# Patient Record
Sex: Male | Born: 1965 | Race: White | Hispanic: No | Marital: Single | Smoking: Never smoker
Health system: Southern US, Community
[De-identification: ages and names within clinical notes are randomized; demographics above are authoritative.]

---

## 2020-07-21 ENCOUNTER — Other Ambulatory Visit: Payer: Self-pay

## 2020-07-21 ENCOUNTER — Emergency Department: Payer: 59

## 2020-07-21 ENCOUNTER — Emergency Department
Admission: EM | Admit: 2020-07-21 | Discharge: 2020-07-21 | Disposition: A | Discharge status: Home / Self Care | Payer: 59 | Attending: Emergency Medicine | Admitting: Emergency Medicine

## 2020-07-21 DIAGNOSIS — Z5321 Procedure and treatment not carried out due to patient leaving prior to being seen by health care provider: Secondary | ICD-10-CM | POA: Insufficient documentation

## 2020-07-21 DIAGNOSIS — M791 Myalgia, unspecified site: Secondary | ICD-10-CM | POA: Diagnosis not present

## 2020-07-21 DIAGNOSIS — R0602 Shortness of breath: Secondary | ICD-10-CM | POA: Diagnosis not present

## 2020-07-21 DIAGNOSIS — R05 Cough: Secondary | ICD-10-CM | POA: Insufficient documentation

## 2020-07-21 LAB — CBC WITH DIFFERENTIAL/PLATELET
Abs Immature Granulocytes: 0.01 10*3/uL (ref 0.00–0.07)
Basophils Absolute: 0 10*3/uL (ref 0.0–0.1)
Basophils Relative: 0 %
Eosinophils Absolute: 0 10*3/uL (ref 0.0–0.5)
Eosinophils Relative: 0 %
HCT: 45.6 % (ref 39.0–52.0)
Hemoglobin: 16 g/dL (ref 13.0–17.0)
Immature Granulocytes: 0 %
Lymphocytes Relative: 15 %
Lymphs Abs: 0.5 10*3/uL — ABNORMAL LOW (ref 0.7–4.0)
MCH: 30.4 pg (ref 26.0–34.0)
MCHC: 35.1 g/dL (ref 30.0–36.0)
MCV: 86.5 fL (ref 80.0–100.0)
Monocytes Absolute: 0.1 10*3/uL (ref 0.1–1.0)
Monocytes Relative: 3 %
Neutro Abs: 2.7 10*3/uL (ref 1.7–7.7)
Neutrophils Relative %: 82 %
Platelets: 102 10*3/uL — ABNORMAL LOW (ref 150–400)
RBC: 5.27 MIL/uL (ref 4.22–5.81)
RDW: 12.1 % (ref 11.5–15.5)
Smear Review: DECREASED
WBC: 3.2 10*3/uL — ABNORMAL LOW (ref 4.0–10.5)
nRBC: 0 % (ref 0.0–0.2)

## 2020-07-21 LAB — COMPREHENSIVE METABOLIC PANEL
ALT: 16 U/L (ref 0–44)
AST: 30 U/L (ref 15–41)
Albumin: 3.8 g/dL (ref 3.5–5.0)
Alkaline Phosphatase: 49 U/L (ref 38–126)
Anion gap: 10 (ref 5–15)
BUN: 17 mg/dL (ref 6–20)
CO2: 23 mmol/L (ref 22–32)
Calcium: 8.4 mg/dL — ABNORMAL LOW (ref 8.9–10.3)
Chloride: 103 mmol/L (ref 98–111)
Creatinine, Ser: 1.2 mg/dL (ref 0.61–1.24)
GFR calc Af Amer: >60 mL/min (ref >60)
GFR calc non Af Amer: >60 mL/min (ref >60)
Glucose, Bld: 139 mg/dL — ABNORMAL HIGH (ref 70–99)
Potassium: 4 mmol/L (ref 3.5–5.1)
Sodium: 136 mmol/L (ref 135–145)
Total Bilirubin: 0.6 mg/dL (ref 0.3–1.2)
Total Protein: 7.2 g/dL (ref 6.5–8.1)

## 2020-07-21 LAB — LACTIC ACID, PLASMA: Lactic Acid, Venous: 1.8 mmol/L (ref 0.5–1.9)

## 2020-07-21 NOTE — ED Triage Notes (Signed)
Pt states he has been feelings sick since Saturday- pt states he has had generalized body aches, no taste, and a slight cough- pt states sometimes he is [facility]

## 2020-07-25 ENCOUNTER — Ambulatory Visit (HOSPITAL_COMMUNITY)
Admission: RE | Admit: 2020-07-25 | Discharge: 2020-07-25 | Disposition: A | Discharge status: Home / Self Care | Payer: 59 | Source: Ambulatory Visit | Attending: Pulmonary Disease | Admitting: Pulmonary Disease

## 2020-07-25 ENCOUNTER — Other Ambulatory Visit (HOSPITAL_COMMUNITY): Payer: Self-pay | Admitting: Nurse Practitioner

## 2020-07-25 DIAGNOSIS — U071 COVID-19: Secondary | ICD-10-CM

## 2020-07-25 DIAGNOSIS — J9601 Acute respiratory failure with hypoxia: Secondary | ICD-10-CM | POA: Diagnosis not present

## 2020-07-25 MED ORDER — EPINEPHRINE 0.3 MG/0.3ML IJ SOAJ: 0.3000 mg | Freq: Once | INTRAMUSCULAR | Status: DC | PRN

## 2020-07-25 MED ORDER — METHYLPREDNISOLONE SODIUM SUCC 125 MG IJ SOLR: 125.0000 mg | Freq: Once | INTRAMUSCULAR | Status: DC | PRN

## 2020-07-25 MED ORDER — FAMOTIDINE IN NACL 20-0.9 MG/50ML-% IV SOLN: 20.0000 mg | Freq: Once | INTRAVENOUS | Status: DC | PRN

## 2020-07-25 MED ORDER — DIPHENHYDRAMINE HCL 50 MG/ML IJ SOLN: 50.0000 mg | Freq: Once | INTRAMUSCULAR | Status: DC | PRN

## 2020-07-25 MED ORDER — ALBUTEROL SULFATE HFA 108 (90 BASE) MCG/ACT IN AERS: 2.0000 | INHALATION_SPRAY | Freq: Once | RESPIRATORY_TRACT | Status: DC | PRN

## 2020-07-25 MED ORDER — SODIUM CHLORIDE 0.9 % IV SOLN
1200.0000 mg | Freq: Once | INTRAVENOUS | Status: AC
  Administered 2020-07-25: 1200 mg via INTRAVENOUS

## 2020-07-25 MED ORDER — SODIUM CHLORIDE 0.9 % IV SOLN: INTRAVENOUS | Status: DC | PRN

## 2020-07-25 NOTE — Progress Notes (Signed)
  Diagnosis: COVID-19  Physician: Dr. Wright  Procedure: [facility] Infusion [facility] Med: casirivimab\imdevimab infusion - Provided patient with casirivimab\imdevimab fact sheet for patients, parents and caregivers prior to infusion.  Complications: No immediate complications noted.  Discharge: Discharged home   Colton [name] [name] 07/25/2020   

## 2020-07-25 NOTE — Discharge Instructions (Signed)

## 2020-07-25 NOTE — Progress Notes (Signed)
I connected by phone with Colton [name] on 07/25/2020 at 10:38 AM to discuss the potential use of an new treatment for mild to moderate COVID-19 viral infection in non-hospitalized patients.  This patient is a 54 y.o. male that meets the FDA criteria for Emergency Use Authorization of casirivimab\imdevimab.  Has a (+) direct SARS-CoV-2 viral test result  Has mild or moderate COVID-19   Is ? 54 years of age and weighs ? 40 kg  Is NOT hospitalized due to COVID-19  Is NOT requiring oxygen therapy or requiring an increase in baseline oxygen flow rate due to COVID-19  Is within 10 days of symptom onset  Has at least one of the high risk factor(s) for progression to severe COVID-19 and/or hospitalization as defined in EUA.  Specific high risk criteria : Other high risk medical condition per CDC:  social vulnerability   I have spoken and communicated the following to the patient or parent/caregiver:  1. FDA has authorized the emergency use of casirivimab\imdevimab for the treatment of mild to moderate COVID-19 in adults and pediatric patients with positive results of direct SARS-CoV-2 viral testing who are 11 years of age and older weighing at least 40 kg, and who are at high risk for progressing to severe COVID-19 and/or hospitalization.  2. The significant known and potential risks and benefits of casirivimab\imdevimab, and the extent to which such potential risks and benefits are unknown.  3. Information on available alternative treatments and the risks and benefits of those alternatives, including clinical trials.  4. Patients treated with casirivimab\imdevimab should continue to self-isolate and use infection control measures (e.g., wear mask, isolate, social distance, avoid sharing personal items, clean and disinfect "high touch" surfaces, and frequent handwashing) according to CDC guidelines.   5. The patient or parent/caregiver has the option to accept or refuse casirivimab\imdevimab  .  After reviewing this information with the patient, The patient agreed to proceed with receiving casirivimab\imdevimab infusion and will be provided a copy of the Fact sheet prior to receiving the infusion.Consuello Masse, DNP, AGNP-C [phone] (Infusion Center Hotline)

## 2020-07-27 ENCOUNTER — Encounter (HOSPITAL_COMMUNITY): Payer: Self-pay

## 2020-07-27 ENCOUNTER — Emergency Department (HOSPITAL_COMMUNITY): Payer: 59

## 2020-07-27 ENCOUNTER — Other Ambulatory Visit: Payer: Self-pay

## 2020-07-27 ENCOUNTER — Inpatient Hospital Stay (HOSPITAL_COMMUNITY): Payer: 59

## 2020-07-27 ENCOUNTER — Inpatient Hospital Stay (HOSPITAL_COMMUNITY)
Admission: EM | Admit: 2020-07-27 | Discharge: 2020-07-29 | DRG: 177 | Disposition: A | Discharge status: Home / Self Care | Payer: 59 | Attending: Internal Medicine | Admitting: Internal Medicine

## 2020-07-27 DIAGNOSIS — U071 COVID-19: Secondary | ICD-10-CM | POA: Diagnosis present

## 2020-07-27 DIAGNOSIS — J9601 Acute respiratory failure with hypoxia: Secondary | ICD-10-CM | POA: Diagnosis present

## 2020-07-27 DIAGNOSIS — J1282 Pneumonia due to coronavirus disease 2019: Secondary | ICD-10-CM | POA: Diagnosis present

## 2020-07-27 LAB — COMPREHENSIVE METABOLIC PANEL
ALT: 28 U/L (ref 0–44)
AST: 29 U/L (ref 15–41)
Albumin: 2.9 g/dL — ABNORMAL LOW (ref 3.5–5.0)
Alkaline Phosphatase: 56 U/L (ref 38–126)
Anion gap: 14 (ref 5–15)
BUN: 20 mg/dL (ref 6–20)
CO2: 22 mmol/L (ref 22–32)
Calcium: 8.8 mg/dL — ABNORMAL LOW (ref 8.9–10.3)
Chloride: 102 mmol/L (ref 98–111)
Creatinine, Ser: 0.78 mg/dL (ref 0.61–1.24)
GFR calc Af Amer: >60 mL/min (ref >60)
GFR calc non Af Amer: >60 mL/min (ref >60)
Glucose, Bld: 106 mg/dL — ABNORMAL HIGH (ref 70–99)
Potassium: 4 mmol/L (ref 3.5–5.1)
Sodium: 138 mmol/L (ref 135–145)
Total Bilirubin: 0.7 mg/dL (ref 0.3–1.2)
Total Protein: 7 g/dL (ref 6.5–8.1)

## 2020-07-27 LAB — CBC WITH DIFFERENTIAL/PLATELET
Abs Immature Granulocytes: 0.14 10*3/uL — ABNORMAL HIGH (ref 0.00–0.07)
Basophils Absolute: 0 10*3/uL (ref 0.0–0.1)
Basophils Relative: 0 %
Eosinophils Absolute: 0.1 10*3/uL (ref 0.0–0.5)
Eosinophils Relative: 1 %
HCT: 42.8 % (ref 39.0–52.0)
Hemoglobin: 14.4 g/dL (ref 13.0–17.0)
Immature Granulocytes: 2 %
Lymphocytes Relative: 7 %
Lymphs Abs: 0.4 10*3/uL — ABNORMAL LOW (ref 0.7–4.0)
MCH: 29.9 pg (ref 26.0–34.0)
MCHC: 33.6 g/dL (ref 30.0–36.0)
MCV: 89 fL (ref 80.0–100.0)
Monocytes Absolute: 0.4 10*3/uL (ref 0.1–1.0)
Monocytes Relative: 6 %
Neutro Abs: 5.4 10*3/uL (ref 1.7–7.7)
Neutrophils Relative %: 84 %
Platelets: 303 10*3/uL (ref 150–400)
RBC: 4.81 MIL/uL (ref 4.22–5.81)
RDW: 12.1 % (ref 11.5–15.5)
WBC: 6.5 10*3/uL (ref 4.0–10.5)
nRBC: 0 % (ref 0.0–0.2)

## 2020-07-27 LAB — ABO/RH: ABO/RH(D): AB POS

## 2020-07-27 LAB — FIBRINOGEN: Fibrinogen: >800 mg/dL — ABNORMAL HIGH (ref 210–475)

## 2020-07-27 LAB — SARS CORONAVIRUS 2 BY RT PCR (HOSPITAL ORDER, PERFORMED IN ~~LOC~~ HOSPITAL LAB): SARS Coronavirus 2: NEGATIVE

## 2020-07-27 LAB — C-REACTIVE PROTEIN: CRP: 21.9 mg/dL — ABNORMAL HIGH (ref <1.0)

## 2020-07-27 LAB — D-DIMER, QUANTITATIVE: D-Dimer, Quant: 2.42 ug/mL-FEU — ABNORMAL HIGH (ref 0.00–0.50)

## 2020-07-27 LAB — FERRITIN: Ferritin: 817 ng/mL — ABNORMAL HIGH (ref 24–336)

## 2020-07-27 LAB — TRIGLYCERIDES: Triglycerides: 152 mg/dL — ABNORMAL HIGH (ref <150)

## 2020-07-27 LAB — LACTIC ACID, PLASMA: Lactic Acid, Venous: 1.4 mmol/L (ref 0.5–1.9)

## 2020-07-27 LAB — LACTATE DEHYDROGENASE: LDH: 312 U/L — ABNORMAL HIGH (ref 98–192)

## 2020-07-27 LAB — PROCALCITONIN: Procalcitonin: <0.1 ng/mL

## 2020-07-27 MED ORDER — IOHEXOL 350 MG/ML SOLN
100.0000 mL | Freq: Once | INTRAVENOUS | Status: AC | PRN
  Administered 2020-07-27: 100 mL via INTRAVENOUS

## 2020-07-27 MED ORDER — SODIUM CHLORIDE 0.9 % IV SOLN: 1.0000 g | INTRAVENOUS | Status: DC

## 2020-07-27 MED ORDER — POLYETHYLENE GLYCOL 3350 17 G PO PACK: 17.0000 g | PACK | Freq: Every day | ORAL | Status: DC | PRN

## 2020-07-27 MED ORDER — METHYLPREDNISOLONE SODIUM SUCC 125 MG IJ SOLR
0.5000 mg/kg | Freq: Two times a day (BID) | INTRAMUSCULAR | Status: DC
  Administered 2020-07-27 – 2020-07-29 (×4): 40.625 mg via INTRAVENOUS
  Filled 2020-07-27 (×4): qty 2

## 2020-07-27 MED ORDER — SODIUM CHLORIDE 0.9 % IV SOLN
100.0000 mg | Freq: Every day | INTRAVENOUS | Status: DC
  Administered 2020-07-28 – 2020-07-29 (×2): 100 mg via INTRAVENOUS
  Filled 2020-07-27 (×2): qty 20

## 2020-07-27 MED ORDER — SODIUM CHLORIDE 0.9 % IV SOLN
200.0000 mg | Freq: Once | INTRAVENOUS | Status: AC
  Administered 2020-07-27: 200 mg via INTRAVENOUS
  Filled 2020-07-27: qty 200

## 2020-07-27 MED ORDER — BARICITINIB 2 MG PO TABS
2.0000 mg | ORAL_TABLET | Freq: Every day | ORAL | Status: DC
  Filled 2020-07-27: qty 1

## 2020-07-27 MED ORDER — ONDANSETRON HCL 4 MG/2ML IJ SOLN: 4.0000 mg | Freq: Four times a day (QID) | INTRAMUSCULAR | Status: DC | PRN

## 2020-07-27 MED ORDER — ONDANSETRON HCL 4 MG PO TABS: 4.0000 mg | ORAL_TABLET | Freq: Four times a day (QID) | ORAL | Status: DC | PRN

## 2020-07-27 MED ORDER — BARICITINIB 2 MG PO TABS
4.0000 mg | ORAL_TABLET | Freq: Every day | ORAL | Status: DC
  Administered 2020-07-27 – 2020-07-29 (×3): 4 mg via ORAL
  Filled 2020-07-27 (×3): qty 2

## 2020-07-27 MED ORDER — ENOXAPARIN SODIUM 40 MG/0.4ML ~~LOC~~ SOLN
40.0000 mg | SUBCUTANEOUS | Status: DC
  Administered 2020-07-27 – 2020-07-28 (×2): 40 mg via SUBCUTANEOUS
  Filled 2020-07-27 (×2): qty 0.4

## 2020-07-27 MED ORDER — AZITHROMYCIN 250 MG PO TABS: 500.0000 mg | ORAL_TABLET | Freq: Every day | ORAL | Status: DC

## 2020-07-27 MED ORDER — HYDROCOD POLST-CPM POLST ER 10-8 MG/5ML PO SUER: 5.0000 mL | Freq: Two times a day (BID) | ORAL | Status: DC | PRN

## 2020-07-27 MED ORDER — GUAIFENESIN-DM 100-10 MG/5ML PO SYRP: 10.0000 mL | ORAL_SOLUTION | ORAL | Status: DC | PRN

## 2020-07-27 MED ORDER — DEXAMETHASONE SODIUM PHOSPHATE 10 MG/ML IJ SOLN
10.0000 mg | Freq: Once | INTRAMUSCULAR | Status: AC
  Administered 2020-07-27: 10 mg via INTRAVENOUS
  Filled 2020-07-27: qty 1

## 2020-07-27 MED ORDER — AZITHROMYCIN 250 MG PO TABS: 250.0000 mg | ORAL_TABLET | Freq: Every day | ORAL | Status: DC

## 2020-07-27 MED ORDER — ACETAMINOPHEN 325 MG PO TABS: 650.0000 mg | ORAL_TABLET | Freq: Four times a day (QID) | ORAL | Status: DC | PRN

## 2020-07-27 MED ORDER — ALBUTEROL SULFATE HFA 108 (90 BASE) MCG/ACT IN AERS: 1.0000 | INHALATION_SPRAY | RESPIRATORY_TRACT | Status: DC | PRN

## 2020-07-27 NOTE — H&P (Addendum)
History and Physical        Hospital Admission Note Date: 07/27/2020  Patient name: Colton [name] Medical record number: [id number] Date of birth: 1965-12-28 Age: 54 y.o. Gender: male  PCP: Patient, No Pcp Per  Patient coming from: home Lives with: significant other At baseline, ambulates: independently  Chief Complaint    Chief Complaint  Patient presents with  . Shortness of Breath      HPI:   This is a 54 year old male with no significant past medical history and recently diagnosed with COVID-19 last Thursday who has not been vaccinated against COVID-19 who has been having increased shortness of breath and worsening myalgias and malaise at home.  He received a dose of Regeneron on 8/30 as an outpatient.  He has noted little oxygen saturation at home.  Per EMS his O2 sats were in the 80s upon arrival.  ED Course: Afebrile hemodynamically stable requiring 4 L/min O2 and tachypneic.  Notable labs: LDH 312, triglycerides 152, ferritin 817, CRP 21.9, WBC 6.5, D-dimer 2.42, procalcitonin <0.1.  COVID-19 negative.  CXR with multifocal pneumonia.  He was given remdesivir in the ED.  Vitals:   07/27/20 1500 07/27/20 1530  BP: 128/85 134/84  Pulse: 78 79  Resp: 20   Temp:    SpO2: 95% 96%     Review of Systems:  Review of Systems  Constitutional: Positive for chills, fever and malaise/fatigue.  Respiratory: Positive for shortness of breath. Negative for wheezing.   Cardiovascular: Negative for chest pain and leg swelling.  Gastrointestinal: Negative for nausea and vomiting.  Genitourinary: Negative for dysuria and urgency.  Musculoskeletal: Positive for myalgias. Negative for falls.  Neurological: Positive for headaches. Negative for loss of consciousness.       Generalized weakness  All other systems reviewed and are negative.   Medical/Social/Family History   Past  Medical History: History reviewed. No pertinent past medical history.  History reviewed. No pertinent surgical history.  Medications: Prior to Admission medications   Medication Sig Start Date End Date Taking? Authorizing Provider  acetaminophen (TYLENOL) 500 MG tablet Take 500 mg by mouth every 6 (six) hours as needed for mild pain or headache.   Yes [provider]    Allergies:  No Known Allergies  Social History:  reports that he has never smoked. He has never used smokeless tobacco. He reports current alcohol use of about 6.0 standard drinks of alcohol per week. No history on file for drug use.  Family History: No family history on file.   Objective   Physical Exam: Blood pressure 134/84, pulse 79, temperature 99.4 F (37.4 C), temperature source Oral, resp. rate 20, SpO2 96 %.  Physical Exam Vitals and nursing note reviewed.  Constitutional:      Appearance: Normal appearance. He is diaphoretic.  HENT:     Head: Normocephalic and atraumatic.  Eyes:     Conjunctiva/sclera: Conjunctivae normal.  Cardiovascular:     Rate and Rhythm: Normal rate.     Pulses: Normal pulses.  Pulmonary:     Effort: Pulmonary effort is normal. No tachypnea.     Comments: On nasal canula Abdominal:     General: Abdomen is flat.     Palpations:  Abdomen is soft.  Musculoskeletal:        General: No swelling or tenderness.  Skin:    Coloration: Skin is not jaundiced or pale.  Neurological:     Mental Status: He is alert. Mental status is at baseline.  Psychiatric:        Mood and Affect: Mood normal.        Behavior: Behavior normal.     LABS on Admission: I have personally reviewed all the labs and imaging below    Basic Metabolic Panel: Recent Labs  Lab 07/21/20 1650 07/27/20 0658  NA 136 138  K 4.0 4.0  CL 103 102  CO2 23 22  GLUCOSE 139* 106*  BUN 17 20  CREATININE 1.20 0.78  CALCIUM 8.4* 8.8*   Liver Function Tests: Recent Labs  Lab 07/21/20 1650  07/27/20 0658  AST 30 29  ALT 16 28  ALKPHOS 49 56  BILITOT 0.6 0.7  PROT 7.2 7.0  ALBUMIN 3.8 2.9*   No results for input(s): LIPASE, AMYLASE in the last 168 hours. No results for input(s): AMMONIA in the last 168 hours. CBC: Recent Labs  Lab 07/21/20 1650 07/21/20 1650 07/27/20 0658  WBC 3.2*  --  6.5  NEUTROABS 2.7   < > 5.4  HGB 16.0  --  14.4  HCT 45.6  --  42.8  MCV 86.5   < > 89.0  PLT 102*  --  303   < > = values in this interval not displayed.   Cardiac Enzymes: No results for input(s): CKTOTAL, CKMB, CKMBINDEX, TROPONINI in the last 168 hours. BNP: Invalid input(s): POCBNP CBG: No results for input(s): GLUCAP in the last 168 hours.  Radiological Exams on Admission:  DG Chest Port 1 View  Result Date: 07/27/2020 CLINICAL DATA:  Shortness of breath and chest pain.  COVID positive. EXAM: PORTABLE CHEST 1 VIEW COMPARISON:  07/21/2020 FINDINGS: 0709 hours. Low lung volumes. Interval progression of the peripheral patchy ground-glass and consolidative airspace opacity consistent with multifocal pneumonia. No pleural effusion. Cardiopericardial silhouette is at upper limits of normal for size. The visualized bony structures of the thorax show no acute abnormality. Telemetry leads overlie the chest. IMPRESSION: Interval progression of peripheral patchy ground-glass and consolidative airspace disease consistent with multifocal pneumonia. Electronically Signed   By: Kennith Center M.D.   On: 07/27/2020 07:31      EKG: Independently reviewed.    A & P   Active Problems:   Acute hypoxemic respiratory failure due to COVID-19 (HCC)   1. Acute hypoxic respiratory failure, suspect secondary to COVID-19 infection a. Requiring 4 L/min nasal cannula, room air at baseline b. despite negative COVID-19 test today however had a COVID-19 positive last Thursday (no record in system) - received Regeneron as an outpatient 8/30 c. Remdesivir d. Baricitinib e. Solu-Medrol f. Despite  negative CXR findings concerning for multifocal pneumonia, he is afebrile here, has a negative procalcitonin and no leukocytosis - will hold antibiotics for now g. CTA chest to rule out PE given elevated D-dimer and worsening hypoxia h. Trend inflammatory markers i. Incentive spirometry j. Flutter valve k. Albuterol inhaler q4h PRN   DVT prophylaxis: lovenox   Code Status: Full Code  Diet: heart healthy Family Communication: Admission, patients condition and plan of care including tests being ordered have been discussed with the patient who indicates understanding and agrees with the plan and Code Status.  Disposition Plan: The appropriate patient status for this patient is INPATIENT. Inpatient status is  judged to be reasonable and necessary in order to provide the required intensity of service to ensure the patient's safety. The patient's presenting symptoms, physical exam findings, and initial radiographic and laboratory data in the context of their chronic comorbidities is felt to place them at high risk for further clinical deterioration. Furthermore, it is not anticipated that the patient will be medically stable for discharge from the hospital within 2 midnights of admission. The following factors support the patient status of inpatient.   " The patient's presenting symptoms include shortness of breath, hypoxia. " The worrisome physical exam findings include diaphoresis, appears ill, requiring nasal canula. " The initial radiographic and laboratory data are worrisome because of multifocal pneumonia on CXR, elevated D dimer. " The chronic co-morbidities include: no pertinent history.   * I certify that at the point of admission it is my clinical judgment that the patient will require inpatient hospital care spanning beyond 2 midnights from the point of admission due to high intensity of service, high risk for further deterioration and high frequency of surveillance required.*   Status is:  Inpatient  Remains inpatient appropriate because:Inpatient level of care appropriate due to severity of illness   Dispo: The patient is from: Home              Anticipated d/c is to: Home              Anticipated d/c date is: 3 days              Patient currently is not medically stable to d/c.    Consultants  . none  Procedures  . none  Time Spent on Admission: 60 minutes    Jae Dire, DO [facility] Pager [phone] 07/27/2020, 4:18 PM

## 2020-07-27 NOTE — ED Triage Notes (Signed)
Patient arrived via [facility] with complaints of shortness of breath. Patient diagnosed with Covid-19 on Thursday. Ems states 80% on RA upon arrival. 98% O2 on 2L

## 2020-07-27 NOTE — ED Provider Notes (Addendum)
Assumed care of patient at 7 AM.  Patient with Covid for the past 10 days or so.  Room air oxygenation is 80 but on 2 to 4 L patient is above 90%.  Overall appears comfortable but hypoxia in the setting of Covid will need admission.  Awaiting lab work and imaging anticipate admission.  Given steroids and IV remdesivir.  No significant anemia, electrolyte abnormality.  Inflammatory markers elevated including fibrinogen, LDH, triglycerides, D-dimer.  Patient comfortable on several liters of oxygen.  Mildly tachypneic.  Will admit to hospitalist for further care.  However repeat Covid testing here is negative but suspect symptoms are secondary to Covid.  We'll get a CT scan of chest after discussion with medicine.  This chart was dictated using voice recognition software.  Despite best efforts to proofread,  errors can occur which can change the documentation meaning.   Colton [name] was evaluated in Emergency Department on 07/27/2020 for the symptoms described in the history of present illness. He was evaluated in the context of the global COVID-19 pandemic, which necessitated consideration that the patient might be at risk for infection with the SARS-CoV-2 virus that causes COVID-19. Institutional protocols and algorithms that pertain to the evaluation of patients at risk for COVID-19 are in a state of rapid change based on information released by regulatory bodies including the CDC and federal and state organizations. These policies and algorithms were followed during the patient's care in the ED.    Virgina Norfolk, DO 07/27/20 0809    Virgina Norfolk, DO 07/27/20 5885

## 2020-07-27 NOTE — ED Provider Notes (Signed)
[facility]-EMERGENCY DEPT Provider Note   CSN: [id number] Arrival date & time: 07/27/20  0539     History Chief Complaint  Patient presents with  . Shortness of Breath    Colton [name] is a 54 y.o. male.  HPI     This a 54 year old male with no reported past medical history who presents with worsening shortness of breath.  Patient reports he is on day 13 of symptoms from COVID-19.  He reports shortness of breath, decreased exercise tolerance, cough, myalgias, back pain.  He tested positive for COVID-19 last Thursday.  However, he began having symptoms the Saturday before that.  He did receive a dose of Regeneron as an outpatient on 8/30.  He denies any history of smoking.  Patient reports that "I have just been trying to get through it."  He states however his symptoms have gotten worse and he has noted low O2 sats at home.  Per EMS O2 sats were in the 80s upon arrival.  He is not vaccinated against COVID-19.  History reviewed. No pertinent past medical history.  There are no problems to display for this patient.   History reviewed. No pertinent surgical history.     No family history on file.  Social History   Tobacco Use  . Smoking status: Never Smoker  . Smokeless tobacco: Never Used  Substance Use Topics  . Alcohol use: Yes    Alcohol/week: 6.0 standard drinks    Types: 6 Cans of beer per week  . Drug use: Not on file    Home Medications Prior to Admission medications   Not on File    Allergies    Patient has no known allergies.  Review of Systems   Review of Systems  Constitutional: Positive for chills and fever.  Respiratory: Positive for cough and shortness of breath.   Cardiovascular: Negative for chest pain.  Gastrointestinal: Positive for diarrhea and nausea. Negative for abdominal pain and vomiting.  Genitourinary: Negative for dysuria.  All other systems reviewed and are negative.   Physical Exam Updated Vital Signs BP  123/82 (BP Location: Left Arm)   Pulse 93   Temp 99.4 F (37.4 C) (Oral)   Resp (!) 29   SpO2 97%   Physical Exam Vitals and nursing note reviewed.  Constitutional:      Appearance: He is well-developed. He is ill-appearing. He is not toxic-appearing.  HENT:     Head: Normocephalic and atraumatic.  Eyes:     Pupils: Pupils are equal, round, and reactive to light.  Cardiovascular:     Rate and Rhythm: Normal rate and regular rhythm.     Heart sounds: Normal heart sounds. No murmur heard.   Pulmonary:     Effort: Pulmonary effort is normal. No respiratory distress.     Breath sounds: Rhonchi present. No wheezing.     Comments: Tachypnea noted, speaking in short sentences, nasal cannula in place Abdominal:     General: Bowel sounds are normal.     Palpations: Abdomen is soft.     Tenderness: There is no abdominal tenderness. There is no rebound.  Musculoskeletal:     Cervical back: Neck supple.     Right lower leg: No tenderness. No edema.     Left lower leg: No tenderness. No edema.  Lymphadenopathy:     Cervical: No cervical adenopathy.  Skin:    General: Skin is warm and dry.  Neurological:     Mental Status: He is alert and  oriented to person, place, and time.  Psychiatric:        Mood and Affect: Mood normal.     ED Results / Procedures / Treatments   Labs (all labs ordered are listed, but only abnormal results are displayed) Labs Reviewed  SARS CORONAVIRUS 2 BY RT PCR (HOSPITAL ORDER, PERFORMED IN [facility])  CULTURE, BLOOD (ROUTINE X 2)  CULTURE, BLOOD (ROUTINE X 2)  LACTIC ACID, PLASMA  LACTIC ACID, PLASMA  CBC WITH DIFFERENTIAL/PLATELET  COMPREHENSIVE METABOLIC PANEL  D-DIMER, QUANTITATIVE (NOT AT [facility])  PROCALCITONIN  LACTATE DEHYDROGENASE  FERRITIN  TRIGLYCERIDES  FIBRINOGEN  C-REACTIVE PROTEIN    EKG EKG Interpretation  Date/Time:  Wednesday July 27 2020 06:31:14 EDT Ventricular Rate:  92 PR Interval:    QRS  Duration: 89 QT Interval:  348 QTC Calculation: 431 R Axis:   46 Text Interpretation: Sinus rhythm Borderline short PR interval RSR' in V1 or V2, probably normal variant Confirmed by Ross Marcus ([id number]) on 07/27/2020 6:44:03 AM   Radiology No results found.  Procedures Procedures (including critical care time)  CRITICAL CARE Performed by: Shon Baton   Total critical care time: 35 minutes  Critical care time was exclusive of separately billable procedures and treating other patients.  Critical care was necessary to treat or prevent imminent or life-threatening deterioration.  Critical care was time spent personally by me on the following activities: development of treatment plan with patient and/or surrogate as well as nursing, discussions with consultants, evaluation of patient's response to treatment, examination of patient, obtaining history from patient or surrogate, ordering and performing treatments and interventions, ordering and review of laboratory studies, ordering and review of radiographic studies, pulse oximetry and re-evaluation of patient's condition.   Medications Ordered in ED Medications  dexamethasone (DECADRON) injection 10 mg (10 mg Intravenous Given 07/27/20 0160)    ED Course  I have reviewed the triage vital signs and the nursing notes.  Pertinent labs & imaging results that were available during my care of the patient were reviewed by me and considered in my medical decision making (see chart for details).    MDM Rules/Calculators/A&P                           Patient presents with ongoing symptoms of COVID-19.  Found hypoxic by EMS.  He is overall nontoxic on exam.  He is on 2 L of nasal cannula and satting mid 90s.  He does appear tachypneic and is speaking in short sentences.  EKG without acute ischemic changes.  COVID-19 work-up initiated.  I cannot see his prior test results but I do see where he received his Regeneron infusion on 8/30.   Work-up pending.  Patient will need admission for hypoxic respiratory failure.  Patient signed out to oncoming provider.  Final Clinical Impression(s) / ED Diagnoses Final diagnoses:  COVID-19  Acute respiratory failure with hypoxia [facility])    Rx / DC Orders ED Discharge Orders    None       Shon Baton, MD 07/27/20 [phone]

## 2020-07-28 LAB — CBC WITH DIFFERENTIAL/PLATELET
Abs Immature Granulocytes: 0.13 10*3/uL — ABNORMAL HIGH (ref 0.00–0.07)
Basophils Absolute: 0 10*3/uL (ref 0.0–0.1)
Basophils Relative: 0 %
Eosinophils Absolute: 0 10*3/uL (ref 0.0–0.5)
Eosinophils Relative: 0 %
HCT: 45.4 % (ref 39.0–52.0)
Hemoglobin: 15.3 g/dL (ref 13.0–17.0)
Immature Granulocytes: 2 %
Lymphocytes Relative: 9 %
Lymphs Abs: 0.5 10*3/uL — ABNORMAL LOW (ref 0.7–4.0)
MCH: 30.5 pg (ref 26.0–34.0)
MCHC: 33.7 g/dL (ref 30.0–36.0)
MCV: 90.6 fL (ref 80.0–100.0)
Monocytes Absolute: 0.2 10*3/uL (ref 0.1–1.0)
Monocytes Relative: 4 %
Neutro Abs: 4.8 10*3/uL (ref 1.7–7.7)
Neutrophils Relative %: 85 %
Platelets: 396 10*3/uL (ref 150–400)
RBC: 5.01 MIL/uL (ref 4.22–5.81)
RDW: 12.2 % (ref 11.5–15.5)
WBC: 5.7 10*3/uL (ref 4.0–10.5)
nRBC: 0 % (ref 0.0–0.2)

## 2020-07-28 LAB — D-DIMER, QUANTITATIVE: D-Dimer, Quant: 2.29 ug/mL-FEU — ABNORMAL HIGH (ref 0.00–0.50)

## 2020-07-28 LAB — C-REACTIVE PROTEIN: CRP: 23 mg/dL — ABNORMAL HIGH (ref <1.0)

## 2020-07-28 LAB — HIV ANTIBODY (ROUTINE TESTING W REFLEX): HIV Screen 4th Generation wRfx: NONREACTIVE

## 2020-07-28 LAB — MAGNESIUM: Magnesium: 2.4 mg/dL (ref 1.7–2.4)

## 2020-07-28 LAB — FERRITIN: Ferritin: 978 ng/mL — ABNORMAL HIGH (ref 24–336)

## 2020-07-28 NOTE — Plan of Care (Signed)
Plan of care discussed.   

## 2020-07-28 NOTE — Progress Notes (Signed)
PROGRESS NOTE    Colton [name]  HQI:[id number] DOB: 1966-06-19 DOA: 07/27/2020 PCP: Patient, No Pcp Per    Brief Narrative:  This is a 54 year old male with no significant past medical history and recently diagnosed with COVID-19 last Thursday who has not been vaccinated against COVID-19 who has been having increased shortness of breath and worsening myalgias and malaise at home.  He received a dose of Regeneron on 8/30 as an outpatient.  He has noted little oxygen saturation at home.  Per EMS his O2 sats were in the 80s upon arrival.  ED Course: Afebrile hemodynamically stable requiring 4 L/min O2 and tachypneic.  Notable labs: LDH 312, triglycerides 152, ferritin 817, CRP 21.9, WBC 6.5, D-dimer 2.42, procalcitonin <0.1.  COVID-19 negative.  CXR with multifocal pneumonia.  He was given remdesivir in the ED.   Assessment & Plan:   Active Problems:   Acute hypoxemic respiratory failure due to COVID-19 [facility](HCC)  Acute hypoxemic respiratory failure due to COVID-19 virus infection: Continue to monitor due to significant symptoms  chest physiotherapy, incentive spirometry, deep breathing exercises, sputum induction, mucolytic's and bronchodilators. Supplemental oxygen to keep saturations more than 90%. Covid directed therapy with , steroids , Solu-Medrol 0.5 mg/kg twice a day remdesivir, day 2/5 Baricitinib day 2/14 or until hospital discharge antibiotics, not indicated Due to severity of symptoms, patient will need daily inflammatory markers, chest x-rays, liver function test to monitor and direct COVID-19 therapies.  COVID-19 Labs  Recent Labs    07/27/20 0658 07/28/20 0352  DDIMER 2.42* 2.29*  FERRITIN 817* 978*  LDH 312*  --   CRP 21.9* 23.0*    Lab Results  Component Value Date   SARSCOV2NAA NEGATIVE 07/27/2020  Reported positive test on 8/26 at home   SpO2: 95 % O2 Flow Rate (L/min): 4 L/min   DVT prophylaxis: enoxaparin (LOVENOX) injection 40 mg Start: 07/27/20  2200 SCDs Start: 07/27/20 1534   Code Status: Full code Family Communication: None Disposition Plan: Status is: Inpatient  Remains inpatient appropriate because:Inpatient level of care appropriate due to severity of illness   Dispo: The patient is from: Home              Anticipated d/c is to: Home              Anticipated d/c date is: 2 days              Patient currently is not medically stable to d/c.         Consultants:   None  Procedures:   None  Antimicrobials:  Anti-infectives (From admission, onward)   Start     Dose/Rate Route Frequency Ordered Stop   07/28/20 1000  remdesivir 100 mg in sodium chloride 0.9 % 100 mL IVPB       "Followed by" Linked Group Details   100 mg 200 mL/hr over 30 Minutes Intravenous Daily 07/27/20 0724 08/01/20 0959   07/28/20 1000  azithromycin (ZITHROMAX) tablet 250 mg  Status:  Discontinued       "Followed by" Linked Group Details   250 mg Oral Daily 07/27/20 1533 07/27/20 1652   07/27/20 1545  cefTRIAXone (ROCEPHIN) 1 g in sodium chloride 0.9 % 100 mL IVPB  Status:  Discontinued        1 g 200 mL/hr over 30 Minutes Intravenous Every 24 hours 07/27/20 1533 07/27/20 1652   07/27/20 1545  azithromycin (ZITHROMAX) tablet 500 mg  Status:  Discontinued       "Followed by"  Linked Group Details   500 mg Oral Daily 07/27/20 1533 07/27/20 1652   07/27/20 0800  remdesivir 200 mg in sodium chloride 0.9% 250 mL IVPB       "Followed by" Linked Group Details   200 mg 580 mL/hr over 30 Minutes Intravenous Once 07/27/20 0724 07/27/20 2585         Subjective: Patient was seen and examined.  No overnight events.  He was on 3 to 4 L of oxygen.  Very anxious to not to go to work.  He wants to go home as he feels better than yesterday and he wants to go back to work tomorrow.  I explained to him that he has active viral infection and needing supplemental oxygen. Will monitor him tonight, if remains stable anticipate go home tomorrow with  outpatient infusion.  Objective: Vitals:   07/27/20 1914 07/27/20 2055 07/28/20 0100 07/28/20 0447  BP:  122/83 117/83 109/77  Pulse:  79 75 76  Resp:  18 18 16   Temp:  97.9 F (36.6 C) 97.7 F (36.5 C) 97.7 F (36.5 C)  TempSrc:   Oral   SpO2:  93% 93% 95%  Weight: 74.5 kg     Height: 5\' 6"  (1.676 m)       Intake/Output Summary (Last 24 hours) at 07/28/2020 1339 Last data filed at 07/28/2020 0900 Gross per 24 hour  Intake 1800 ml  Output 400 ml  Net 1400 ml   Filed Weights   07/27/20 1914  Weight: 74.5 kg    Examination:  General exam: Appears comfortable, however is anxious. Respiratory system: Clear to auscultation. Respiratory effort normal.  No added sounds. Cardiovascular system: S1 & S2 heard, RRR. No JVD, murmurs, rubs, gallops or clicks. No pedal edema. Gastrointestinal system: Abdomen is nondistended, soft and nontender. No organomegaly or masses felt. Normal bowel sounds heard. Central nervous system: Alert and oriented. No focal neurological deficits. Extremities: Symmetric 5 x 5 power. Skin: No rashes, lesions or ulcers Psychiatry: Judgement and insight appear normal. Mood & affect appropriate.     Data Reviewed: I have personally reviewed following labs and imaging studies  CBC: Recent Labs  Lab 07/21/20 1650 07/27/20 0658 07/28/20 0352  WBC 3.2* 6.5 5.7  NEUTROABS 2.7 5.4 4.8  HGB 16.0 14.4 15.3  HCT 45.6 42.8 45.4  MCV 86.5 89.0 90.6  PLT 102* 303 396   Basic Metabolic Panel: Recent Labs  Lab 07/21/20 1650 07/27/20 0658 07/28/20 0352  NA 136 138  --   K 4.0 4.0  --   CL 103 102  --   CO2 23 22  --   GLUCOSE 139* 106*  --   BUN 17 20  --   CREATININE 1.20 0.78  --   CALCIUM 8.4* 8.8*  --   MG  --   --  2.4   GFR: Estimated Creatinine Clearance: 98.6 mL/min (by C-G formula based on SCr of 0.78 mg/dL). Liver Function Tests: Recent Labs  Lab 07/21/20 1650 07/27/20 0658  AST 30 29  ALT 16 28  ALKPHOS 49 56  BILITOT 0.6 0.7   PROT 7.2 7.0  ALBUMIN 3.8 2.9*   No results for input(s): LIPASE, AMYLASE in the last 168 hours. No results for input(s): AMMONIA in the last 168 hours. Coagulation Profile: No results for input(s): INR, PROTIME in the last 168 hours. Cardiac Enzymes: No results for input(s): CKTOTAL, CKMB, CKMBINDEX, TROPONINI in the last 168 hours. BNP (last 3 results) No results for input(s):  PROBNP in the last 8760 hours. HbA1C: No results for input(s): HGBA1C in the last 72 hours. CBG: No results for input(s): GLUCAP in the last 168 hours. Lipid Profile: Recent Labs    07/27/20 0658  TRIG 152*   Thyroid Function Tests: No results for input(s): TSH, T4TOTAL, FREET4, T3FREE, THYROIDAB in the last 72 hours. Anemia Panel: Recent Labs    07/27/20 0658 07/28/20 0352  FERRITIN 817* 978*   Sepsis Labs: Recent Labs  Lab 07/21/20 1650 07/27/20 0658  PROCALCITON  --  <0.10  LATICACIDVEN 1.8 1.4    Recent Results (from the past 240 hour(s))  SARS Coronavirus 2 by RT PCR (hospital order, performed in [facility] hospital lab) Nasopharyngeal Nasopharyngeal Swab     Status: None   Collection Time: 07/27/20  6:58 AM   Specimen: Nasopharyngeal Swab  Result Value Ref Range Status   SARS Coronavirus 2 NEGATIVE NEGATIVE Final    Comment: (NOTE) SARS-CoV-2 target nucleic acids are NOT DETECTED.  The SARS-CoV-2 RNA is generally detectable in upper and lower respiratory specimens during the acute phase of infection. The lowest concentration of SARS-CoV-2 viral copies this assay can detect is 250 copies / mL. A negative result does not preclude SARS-CoV-2 infection and should not be used as the sole basis for treatment or other patient management decisions.  A negative result may occur with improper specimen collection / handling, submission of specimen other than nasopharyngeal swab, presence of viral mutation(s) within the areas targeted by this assay, and inadequate number of viral  copies (<250 copies / mL). A negative result must be combined with clinical observations, patient history, and epidemiological information.  Fact Sheet for Patients:   BoilerBrush.com.cy  Fact Sheet for Healthcare Providers: https://pope.com/  This test is not yet approved or  cleared by the [place] FDA and has been authorized for detection and/or diagnosis of SARS-CoV-2 by FDA under an Emergency Use Authorization (EUA).  This EUA will remain in effect (meaning this test can be used) for the duration of the COVID-19 declaration under Section 564(b)(1) of the Act, 21 U.S.C. section 360bbb-3(b)(1), unless the authorization is terminated or revoked sooner.  Performed at [facility], [address] [place], [place] [address]   Blood Culture (routine x 2)     Status: None (Preliminary result)   Collection Time: 07/27/20  6:58 AM   Specimen: BLOOD  Result Value Ref Range Status   Specimen Description   Final    BLOOD LEFT ANTECUBITAL Performed at [facility], [address] [place], [place] [address]    Special Requests   Final    BOTTLES DRAWN AEROBIC AND ANAEROBIC Blood Culture adequate volume Performed at [facility], [address] [place], [place] [address]    Culture   Final    NO GROWTH < 24 HOURS Performed at [facility], [address], [place], [place] [address]    Report Status PENDING  Incomplete  Blood Culture (routine x 2)     Status: None (Preliminary result)   Collection Time: 07/27/20  4:50 PM   Specimen: BLOOD  Result Value Ref Range Status   Specimen Description   Final    BLOOD RIGHT ANTECUBITAL Performed at [facility], [address] [place], [place] [address]    Special Requests   Final    BOTTLES DRAWN AEROBIC ONLY Blood Culture adequate volume Performed at [facility], [address] [place],  [place] [address]    Culture   Final    NO GROWTH < 12 HOURS Performed at [facility], [address], [place], [place] [address]    Report Status PENDING  Incomplete         Radiology Studies: CT ANGIO CHEST PE W OR WO CONTRAST  Result Date: 07/27/2020 CLINICAL DATA:  54 year old patient who is COVID-19 positive, presenting with progressively worsening shortness of breath, myalgias and malaise. Hypoxemia with oxygen saturation in the 80s. Elevated D-dimer. EXAM: CT ANGIOGRAPHY CHEST WITH CONTRAST TECHNIQUE: Multidetector CT imaging of the chest was performed using the standard protocol during bolus administration of intravenous contrast. Multiplanar CT image reconstructions and MIPs were obtained to evaluate the vascular anatomy. CONTRAST:  OMNIPAQUE IOHEXOL 350 MG/ML IV. COMPARISON:  None. FINDINGS: Respiratory motion blurred images throughout the examination. Cardiovascular: Contrast opacification of the pulmonary arteries is good. Allowing for the respiratory motion, no visible filling defects within either main pulmonary artery or their segmental branches in either lung to suggest pulmonary embolism. Heart size upper normal. No visible coronary atherosclerosis. No pericardial effusion. No evidence of atherosclerosis involving the thoracic or upper abdominal aorta or their visualized branches. 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 Mediastinum/Nodes: Upper normal sized subcarinal and RIGHT paraesophageal lymph nodes (stations 6 and 7). No pathologic lymphadenopathy. Normal appearing esophagus. Normal-appearing thyroid gland. Lungs/Pleura: Ground-glass airspace opacities throughout both lungs in a peripheral distribution, more focally confluent in the LEFT LOWER LOBE. No evidence of interstitial lung disease. No parenchymal nodules or masses. No pleural effusions. Central airways patent without significant bronchial  wall thickening. Upper Abdomen: Unremarkable for the early arterial phase of enhancement. Musculoskeletal: Mild degenerative disc disease and spondylosis involving the visualized lower cervical spine and throughout the thoracic spine. No acute findings. Other: Mild BILATERAL gynecomastia, RIGHT greater than LEFT. Benign sebaceous cyst measuring approximately 1.7 cm involving the skin of the mid back, just to the LEFT of midline. Review of the MIP images confirms the above findings. IMPRESSION: 1. Respiratory motion degraded examination demonstrates no evidence of central pulmonary embolism. 2. Ground-glass airspace opacities throughout both lungs in a peripheral distribution, more focally confluent in the LEFT LOWER LOBE, indicating COVID 19 pneumonia. Other processes such as influenza pneumonia and organizing pneumonia, as can be seen with drug toxicity and connective tissue disease, can cause a similar imaging pattern. 3. Upper normal sized subcarinal and RIGHT paraesophageal lymph nodes, likely reactive. 4. Mild BILATERAL gynecomastia, RIGHT greater than LEFT. 5. 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 Typically this is of no clinical significance. Electronically Signed   By: Hulan Saas M.D.   On: 07/27/2020 19:18   DG Chest Port 1 View  Result Date: 07/27/2020 CLINICAL DATA:  Shortness of breath and chest pain.  COVID positive. EXAM: PORTABLE CHEST 1 VIEW COMPARISON:  07/21/2020 FINDINGS: 0709 hours. Low lung volumes. Interval progression of the peripheral patchy ground-glass and consolidative airspace opacity consistent with multifocal pneumonia. No pleural effusion. Cardiopericardial silhouette is at upper limits of normal for size. The visualized bony structures of the thorax show no acute abnormality. Telemetry leads overlie the chest. IMPRESSION: Interval progression of peripheral patchy ground-glass and consolidative airspace disease consistent with  multifocal pneumonia. Electronically Signed   By: Kennith Center M.D.   On: 07/27/2020 07:31        Scheduled Meds: . baricitinib  4 mg Oral Daily  .  enoxaparin (LOVENOX) injection  40 mg Subcutaneous Q24H  . methylPREDNISolone (SOLU-MEDROL) injection  0.5 mg/kg Intravenous Q12H   Continuous Infusions: . remdesivir 100 mg in NS 100 mL 100 mg (07/28/20 1002)     LOS: 1 day    Time spent: 30 minutes    Dorcas Carrow, MD [facility] Pager [phone]

## 2020-07-29 LAB — CBC WITH DIFFERENTIAL/PLATELET
Abs Immature Granulocytes: 0.29 10*3/uL — ABNORMAL HIGH (ref 0.00–0.07)
Basophils Absolute: 0 10*3/uL (ref 0.0–0.1)
Basophils Relative: 0 %
Eosinophils Absolute: 0 10*3/uL (ref 0.0–0.5)
Eosinophils Relative: 0 %
HCT: 43.6 % (ref 39.0–52.0)
Hemoglobin: 14.3 g/dL (ref 13.0–17.0)
Immature Granulocytes: 2 %
Lymphocytes Relative: 6 %
Lymphs Abs: 0.8 10*3/uL (ref 0.7–4.0)
MCH: 30.1 pg (ref 26.0–34.0)
MCHC: 32.8 g/dL (ref 30.0–36.0)
MCV: 91.8 fL (ref 80.0–100.0)
Monocytes Absolute: 0.6 10*3/uL (ref 0.1–1.0)
Monocytes Relative: 4 %
Neutro Abs: 12.5 10*3/uL — ABNORMAL HIGH (ref 1.7–7.7)
Neutrophils Relative %: 88 %
Platelets: 473 10*3/uL — ABNORMAL HIGH (ref 150–400)
RBC: 4.75 MIL/uL (ref 4.22–5.81)
RDW: 12.2 % (ref 11.5–15.5)
WBC: 14.2 10*3/uL — ABNORMAL HIGH (ref 4.0–10.5)
nRBC: 0 % (ref 0.0–0.2)

## 2020-07-29 LAB — MAGNESIUM: Magnesium: 2.4 mg/dL (ref 1.7–2.4)

## 2020-07-29 LAB — D-DIMER, QUANTITATIVE: D-Dimer, Quant: 1.69 ug/mL-FEU — ABNORMAL HIGH (ref 0.00–0.50)

## 2020-07-29 LAB — C-REACTIVE PROTEIN: CRP: 8.7 mg/dL — ABNORMAL HIGH (ref <1.0)

## 2020-07-29 LAB — FERRITIN: Ferritin: 805 ng/mL — ABNORMAL HIGH (ref 24–336)

## 2020-07-29 MED ORDER — DEXAMETHASONE 6 MG PO TABS: 6.0000 mg | ORAL_TABLET | Freq: Every day | ORAL | 0 refills | Status: AC

## 2020-07-29 NOTE — Progress Notes (Signed)
Pt discharged to home with home Oxygen. Discharge instructions and medication education provided to pt.

## 2020-07-29 NOTE — Progress Notes (Signed)
Patient scheduled for outpatient Remdesivir infusions at 11am on Saturday 9/4 and Sunday 9/5 at [facility]. Please inform the patient to park at [address] [place], [place], as staff will be escorting the patient through the east entrance of the hospital.    There is a wave flag banner located near the entrance on [address]. Turn into this entrance and immediately turn left and park in 1 of the 5 designated Covid Infusion Parking spots. There is a phone number on the sign, please call and let the staff know what spot you are in and we will come out and get you. For questions call [phone].  Thanks.

## 2020-07-29 NOTE — Discharge Instructions (Signed)
Patient scheduled for outpatient Remdesivir infusions at 11am on Saturday 9/4 and Sunday 9/5 at [facility]. Please inform the patient to park at[address], [place], as staff will be escorting the patient through the east entrance of the hospital.Appointments will take approximately 45 minutes.   There is a wave flag banner located near the entrance on [address]. Turn into this entranceand immediatelyturn left and park in 1 of the 5 designated Covid Infusion Parking spots. There is a phone number on the sign, please call and let the staff know what spot you are in and we will come out and get you. For questions call [phone]. Thanks.

## 2020-07-29 NOTE — Progress Notes (Signed)
The patient states, "I'm leaving today, I don't care what I have to do."  He states he has an important business meeting he can't miss, needs to spend some time with his Mom who is in town, and he just can't take it here anymore and he " just needs to get away."  Spoke to him about completion of his IV tx and discussed AMA with him.

## 2020-07-29 NOTE — Plan of Care (Signed)
Plan of care reviewed and discussed with the patient. 

## 2020-07-29 NOTE — Progress Notes (Signed)
Oxygen at rest on room air 90% Oxygen on ambulation on room air 86% Oxygen with 4 liters supplemental oxygen 90%   Patient will need supplemental oxygen to improve his heart rate and symptoms and to relieve hypoxia.

## 2020-07-29 NOTE — Discharge Summary (Signed)
Physician Discharge Summary  Colton [name] XNA:[id number] DOB: 1966/01/11 DOA: 07/27/2020  PCP: Patient, No Pcp Per  Admit date: 07/27/2020 Discharge date: 07/29/2020  Admitted From: Home Disposition: Home  Recommendations for Outpatient Follow-up:  1. Follow up with PCP in 1-2 weeks 2. Please obtain BMP/CBC in one week 3. Please come back to [facility] infusion center for scheduled infusion for next 2 days.  Home Health: Not applicable Equipment/Devices: Oxygen 4 L/min on ambulation  Discharge Condition: Stable CODE STATUS: Full code Diet recommendation: Regular diet  Discharge summary: 54 year old gentleman with no significant medical history recently diagnosed with COVID-19, unvaccinated presented to the ER with increasing shortness of breath and worsening fatigue and weakness.  Received a dose of Regeneron on 8/30 as an outpatient.  On arrival, patient was afebrile hemodynamically stable.  He was hypoxic and requiring 4 L of oxygen and was tachypneic.  CRP was 21.  D-dimer was 2.42.  COVID-19 was negative.  He had a positive test at home from last week.  Chest x-ray with multifocal pneumonia.  Patient was admitted to the hospital and treated with high-dose steroids and remdesivir therapy as well as baricitinib.  Today, patient has symptomatically improved.  He has finished 3 days of treatment with remdesivir and his steroids.  He is ambulating around and needing 4 L of oxygen on mobility.  His inflammatory markers are improving.  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  Since he is a stable, he is discharged home with a scheduled outpatient remdesivir infusion for next 2 days, 7 more days of oral steroids, symptomatic treatment and advised.  Patient was also prescribed oxygen to use at home and instruction to monitor levels at home and report for any difficulty breathing or worsening symptoms.  Discharge Diagnoses:   Active Problems:   Acute hypoxemic respiratory failure due to COVID-19 [facility])    Discharge Instructions  Discharge Instructions    Call MD for:  difficulty breathing, headache or visual disturbances   Complete by: As directed    Call MD for:  extreme fatigue   Complete by: As directed    Call MD for:  persistant nausea and vomiting   Complete by: As directed    Call MD for:  temperature >100.4   Complete by: As directed    Diet general   Complete by: As directed    Discharge instructions   Complete by: As directed    Can use over the counter cough medicine and tylenol Take covid 19 vaccine after improvement of current symptoms Isolate for 21 days from your positive test results   Increase activity slowly   Complete by: As directed      Allergies as of 07/29/2020   No Known Allergies     Medication List    TAKE these medications   acetaminophen 500 MG tablet Commonly known as: TYLENOL Take 500 mg by mouth every 6 (six) hours as needed for mild pain or headache.   dexamethasone 6 MG tablet Commonly known as: Decadron Take 1 tablet (6 mg total) by mouth daily for 7 days.            Durable Medical Equipment  (From admission, onward)         Start     Ordered   07/29/20 0846  For home use only DME oxygen  Once       Comments: Oxygen at rest on room air  90% Oxygen on ambulation on room air 86% Oxygen with 4 liters supplemental oxygen 90%   Patient will need supplemental oxygen to improve his heart rate and symptoms and to relieve hypoxia.  Question Answer Comment  Length of Need 6 Months   Mode or (Route) Nasal cannula   Liters per Minute 4   Frequency Continuous (stationary and portable oxygen unit needed)   Oxygen conserving device Yes   Oxygen delivery system Gas      07/29/20 0845          No Known Allergies  Consultations:  None   Procedures/Studies: DG Chest 2 View  Result Date: 07/21/2020 CLINICAL DATA:  Short of breath.  Body aches  and fever. EXAM: CHEST - 2 VIEW COMPARISON:  None. FINDINGS: Heart is normal in size. Patchy heterogeneous bilateral airspace opacities in a mid-lower lung zone predominant distribution. No pneumothorax or pleural effusion. No acute osseous abnormalities are seen. IMPRESSION: Patchy heterogeneous bilateral airspace opacities in a mid-lower lung zone predominant distribution consistent with multifocal pneumonia. Pattern is typical of COVID-19. Electronically Signed   By: Narda Rutherford M.D.   On: 07/21/2020 17:50   CT ANGIO CHEST PE W OR WO CONTRAST  Result Date: 07/27/2020 CLINICAL DATA:  55 year old patient who is COVID-19 positive, presenting with progressively worsening shortness of breath, myalgias and malaise. Hypoxemia with oxygen saturation in the 80s. Elevated D-dimer. EXAM: CT ANGIOGRAPHY CHEST WITH CONTRAST TECHNIQUE: Multidetector CT imaging of the chest was performed using the standard protocol during bolus administration of intravenous contrast. Multiplanar CT image reconstructions and MIPs were obtained to evaluate the vascular anatomy. CONTRAST:  OMNIPAQUE IOHEXOL 350 MG/ML IV. COMPARISON:  None. FINDINGS: Respiratory motion blurred images throughout the examination. Cardiovascular: Contrast opacification of the pulmonary arteries is good. Allowing for the respiratory motion, no visible filling defects within either main pulmonary artery or their segmental branches in either lung to suggest pulmonary embolism. Heart size upper normal. No visible coronary atherosclerosis. No pericardial effusion. No evidence of atherosclerosis involving the thoracic or upper abdominal aorta or their visualized branches. 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 Mediastinum/Nodes: Upper normal sized subcarinal and RIGHT paraesophageal lymph nodes (stations 6 and 7). No pathologic lymphadenopathy. Normal appearing esophagus. Normal-appearing thyroid  gland. Lungs/Pleura: Ground-glass airspace opacities throughout both lungs in a peripheral distribution, more focally confluent in the LEFT LOWER LOBE. No evidence of interstitial lung disease. No parenchymal nodules or masses. No pleural effusions. Central airways patent without significant bronchial wall thickening. Upper Abdomen: Unremarkable for the early arterial phase of enhancement. Musculoskeletal: Mild degenerative disc disease and spondylosis involving the visualized lower cervical spine and throughout the thoracic spine. No acute findings. Other: Mild BILATERAL gynecomastia, RIGHT greater than LEFT. Benign sebaceous cyst measuring approximately 1.7 cm involving the skin of the mid back, just to the LEFT of midline. Review of the MIP images confirms the above findings. IMPRESSION: 1. Respiratory motion degraded examination demonstrates no evidence of central pulmonary embolism. 2. Ground-glass airspace opacities throughout both lungs in a peripheral distribution, more focally confluent in the LEFT LOWER LOBE, indicating COVID 19 pneumonia. Other processes such as influenza pneumonia and organizing pneumonia, as can be seen with drug toxicity and connective tissue disease, can cause a similar imaging pattern. 3. Upper normal sized subcarinal and RIGHT paraesophageal lymph nodes, likely reactive. 4. Mild BILATERAL gynecomastia, RIGHT greater than LEFT. 5. Compression of the proximal celiac trunk by the arcuate ligament of  the diaphragm. Typically this is of no clinical significance. Electronically Signed   By: Hulan Saashomas  Lawrence M.D.   On: 07/27/2020 19:18   DG Chest Port 1 View  Result Date: 07/27/2020 CLINICAL DATA:  Shortness of breath and chest pain.  COVID positive. EXAM: PORTABLE CHEST 1 VIEW COMPARISON:  07/21/2020 FINDINGS: 0709 hours. Low lung volumes. Interval progression of the peripheral patchy ground-glass and consolidative airspace opacity consistent with multifocal pneumonia. No pleural  effusion. Cardiopericardial silhouette is at upper limits of normal for size. The visualized bony structures of the thorax show no acute abnormality. Telemetry leads overlie the chest. IMPRESSION: Interval progression of peripheral patchy ground-glass and consolidative airspace disease consistent with multifocal pneumonia. Electronically Signed   By: Kennith CenterEric  Mansell M.D.   On: 07/27/2020 07:31   (Echo, Carotid, EGD, Colonoscopy, ERCP)    Subjective: Patient seen and examined.  He feels better.  Still has some shortness of breath and feels nervous on walking, however comfortable to go home and he must go home today. We discussed different options and since he wants to go home, discharged him on request with all the treatments and oxygen arrangements.   Discharge Exam: Vitals:   07/28/20 2034 07/29/20 0425  BP: 119/76 120/80  Pulse: 87 76  Resp:  20  Temp: 99 F (37.2 C) 98.4 F (36.9 C)  SpO2: 96% 95%   Vitals:   07/28/20 0447 07/28/20 1406 07/28/20 2034 07/29/20 0425  BP: 109/77 119/84 119/76 120/80  Pulse: 76 86 87 76  Resp: 16 18  20   Temp: 97.7 F (36.5 C) 98.2 F (36.8 C) 99 F (37.2 C) 98.4 F (36.9 C)  TempSrc:  Oral    SpO2: 95% 91% 96% 95%  Weight:      Height:        General: Pt is alert, awake, not in acute distress Cardiovascular: RRR, S1/S2 +, no rubs, no gallops Respiratory: CTA bilaterally, no wheezing, no rhonchi, no added sounds. Abdominal: Soft, NT, ND, bowel sounds + Extremities: no edema, no cyanosis    The results of significant diagnostics from this hospitalization (including imaging, microbiology, ancillary and laboratory) are listed below for reference.     Microbiology: Recent Results (from the past 240 hour(s))  SARS Coronavirus 2 by RT PCR (hospital order, performed in [facility] hospital lab) Nasopharyngeal Nasopharyngeal Swab     Status: None   Collection Time: 07/27/20  6:58 AM   Specimen: Nasopharyngeal Swab  Result Value Ref Range  Status   SARS Coronavirus 2 NEGATIVE NEGATIVE Final    Comment: (NOTE) SARS-CoV-2 target nucleic acids are NOT DETECTED.  The SARS-CoV-2 RNA is generally detectable in upper and lower respiratory specimens during the acute phase of infection. The lowest concentration of SARS-CoV-2 viral copies this assay can detect is 250 copies / mL. A negative result does not preclude SARS-CoV-2 infection and should not be used as the sole basis for treatment or other patient management decisions.  A negative result may occur with improper specimen collection / handling, submission of specimen other than nasopharyngeal swab, presence of viral mutation(s) within the areas targeted by this assay, and inadequate number of viral copies (<250 copies / mL). A negative result must be combined with clinical observations, patient history, and epidemiological information.  Fact Sheet for Patients:   BoilerBrush.com.cyhttps://www.fda.gov/media/136312/download  Fact Sheet for Healthcare Providers: https://pope.com/https://www.fda.gov/media/136313/download  This test is not yet approved or  cleared by the [place] FDA and has been authorized for detection and/or diagnosis of  SARS-CoV-2 by FDA under an Emergency Use Authorization (EUA).  This EUA will remain in effect (meaning this test can be used) for the duration of the COVID-19 declaration under Section 564(b)(1) of the Act, 21 U.S.C. section 360bbb-3(b)(1), unless the authorization is terminated or revoked sooner.  Performed at [facility], [address] [place], [place] [address]   Blood Culture (routine x 2)     Status: None (Preliminary result)   Collection Time: 07/27/20  6:58 AM   Specimen: BLOOD  Result Value Ref Range Status   Specimen Description   Final    BLOOD LEFT ANTECUBITAL Performed at [facility], [address] [place], [place] [address]    Special Requests   Final    BOTTLES DRAWN AEROBIC AND ANAEROBIC Blood Culture  adequate volume Performed at [facility], [address] [place], [place] [address]    Culture   Final    NO GROWTH 2 DAYS Performed at [facility], [address] [place], [place] [address]    Report Status PENDING  Incomplete  Blood Culture (routine x 2)     Status: None (Preliminary result)   Collection Time: 07/27/20  4:50 PM   Specimen: BLOOD  Result Value Ref Range Status   Specimen Description   Final    BLOOD RIGHT ANTECUBITAL Performed at [facility], [address], [place], [place] [address]    Special Requests   Final    BOTTLES DRAWN AEROBIC ONLY Blood Culture adequate volume Performed at [facility], [address] [place], [place] [address]    Culture   Final    NO GROWTH 2 DAYS Performed at [facility], [address] [place], [place] [address]    Report Status PENDING  Incomplete     Labs: BNP (last 3 results) No results for input(s): BNP in the last 8760 hours. Basic Metabolic Panel: Recent Labs  Lab 07/27/20 0658 07/28/20 0352 07/29/20 0351  NA 138  --   --   K 4.0  --   --   CL 102  --   --   CO2 22  --   --   GLUCOSE 106*  --   --   BUN 20  --   --   CREATININE 0.78  --   --   CALCIUM 8.8*  --   --   MG  --  2.4 2.4   Liver Function Tests: Recent Labs  Lab 07/27/20 0658  AST 29  ALT 28  ALKPHOS 56  BILITOT 0.7  PROT 7.0  ALBUMIN 2.9*   No results for input(s): LIPASE, AMYLASE in the last 168 hours. No results for input(s): AMMONIA in the last 168 hours. CBC: Recent Labs  Lab 07/27/20 0658 07/28/20 0352 07/29/20 0351  WBC 6.5 5.7 14.2*  NEUTROABS 5.4 4.8 12.5*  HGB 14.4 15.3 14.3  HCT 42.8 45.4 43.6  MCV 89.0 90.6 91.8  PLT 303 396 473*   Cardiac Enzymes: No results for input(s): CKTOTAL, CKMB, CKMBINDEX, TROPONINI in the last 168 hours. BNP: Invalid input(s): POCBNP CBG: No results for input(s): GLUCAP in the last 168 hours. D-Dimer Recent  Labs    07/28/20 0352 07/29/20 0351  DDIMER 2.29* 1.69*   Hgb A1c No results for input(s): HGBA1C in the last 72 hours. Lipid Profile Recent Labs    07/27/20 0658  TRIG 152*   Thyroid function studies No results for input(s):  TSH, T4TOTAL, T3FREE, THYROIDAB in the last 72 hours.  Invalid input(s): FREET3 Anemia work up [employer]    07/28/20 0352 07/29/20 0351  FERRITIN 978* 805*   Urinalysis No results found for: COLORURINE, APPEARANCEUR, LABSPEC, PHURINE, GLUCOSEU, HGBUR, BILIRUBINUR, KETONESUR, PROTEINUR, UROBILINOGEN, NITRITE, LEUKOCYTESUR Sepsis Labs Invalid input(s): PROCALCITONIN,  WBC,  LACTICIDVEN Microbiology Recent Results (from the past 240 hour(s))  SARS Coronavirus 2 by RT PCR (hospital order, performed in [facility] hospital lab) Nasopharyngeal Nasopharyngeal Swab     Status: None   Collection Time: 07/27/20  6:58 AM   Specimen: Nasopharyngeal Swab  Result Value Ref Range Status   SARS Coronavirus 2 NEGATIVE NEGATIVE Final    Comment: (NOTE) SARS-CoV-2 target nucleic acids are NOT DETECTED.  The SARS-CoV-2 RNA is generally detectable in upper and lower respiratory specimens during the acute phase of infection. The lowest concentration of SARS-CoV-2 viral copies this assay can detect is 250 copies / mL. A negative result does not preclude SARS-CoV-2 infection and should not be used as the sole basis for treatment or other patient management decisions.  A negative result may occur with improper specimen collection / handling, submission of specimen other than nasopharyngeal swab, presence of viral mutation(s) within the areas targeted by this assay, and inadequate number of viral copies (<250 copies / mL). A negative result must be combined with clinical observations, patient history, and epidemiological information.  Fact Sheet for Patients:   BoilerBrush.com.cy  Fact Sheet for Healthcare  Providers: https://pope.com/  This test is not yet approved or  cleared by the [place] FDA and has been authorized for detection and/or diagnosis of SARS-CoV-2 by FDA under an Emergency Use Authorization (EUA).  This EUA will remain in effect (meaning this test can be used) for the duration of the COVID-19 declaration under Section 564(b)(1) of the Act, 21 U.S.C. section 360bbb-3(b)(1), unless the authorization is terminated or revoked sooner.  Performed at [facility], [address], [place], [place] [address]   Blood Culture (routine x 2)     Status: None (Preliminary result)   Collection Time: 07/27/20  6:58 AM   Specimen: BLOOD  Result Value Ref Range Status   Specimen Description   Final    BLOOD LEFT ANTECUBITAL Performed at [facility], [address], [place], [place] [address]    Special Requests   Final    BOTTLES DRAWN AEROBIC AND ANAEROBIC Blood Culture adequate volume Performed at [facility], [address] [place], [place] [address]    Culture   Final    NO GROWTH 2 DAYS Performed at [facility], [address], [place], [place] [address]    Report Status PENDING  Incomplete  Blood Culture (routine x 2)     Status: None (Preliminary result)   Collection Time: 07/27/20  4:50 PM   Specimen: BLOOD  Result Value Ref Range Status   Specimen Description   Final    BLOOD RIGHT ANTECUBITAL Performed at [facility], [address], [place], [place] [address]    Special Requests   Final    BOTTLES DRAWN AEROBIC ONLY Blood Culture adequate volume Performed at [facility], [address] [place], [place] [address]    Culture   Final    NO GROWTH 2 DAYS Performed at [facility], [address] [place], [place] [address]    Report Status PENDING  Incomplete     Time coordinating  discharge: 40  minutes  SIGNED:   Dorcas Carrow, MD  [facility] 07/29/2020, 11:55 AM

## 2020-07-29 NOTE — TOC Transition Note (Signed)
Transition of Care [facility]) - CM/SW Discharge Note   Patient Details  Name: Colton [name] MRN: [id number] Date of Birth: 1965/12/31  [facility]) CM/SW Contact:  Ida Rogue, LCSW Phone Number: 07/29/2020, 10:01 AM   Clinical Narrative:  Patient d/cing today is in need of home O2-4L.  Called Ashly with [employer] who will have travel O2 delivered here, and also deliver home unit.  Patient has ride home.  No further needs identified. TOC sign off.     Final next level of care: Home/Self Care Barriers to Discharge: No Barriers Identified   Patient Goals and CMS Choice        Discharge Placement                       Discharge Plan and Services                                     Social Determinants of Health (SDOH) Interventions     Readmission Risk Interventions No flowsheet data found.

## 2020-07-30 ENCOUNTER — Ambulatory Visit (HOSPITAL_COMMUNITY)
Admit: 2020-07-30 | Discharge: 2020-07-30 | Disposition: A | Discharge status: Home / Self Care | Payer: 59 | Attending: Pulmonary Disease | Admitting: Pulmonary Disease

## 2020-07-30 DIAGNOSIS — J1282 Pneumonia due to coronavirus disease 2019: Secondary | ICD-10-CM | POA: Insufficient documentation

## 2020-07-30 DIAGNOSIS — U071 COVID-19: Secondary | ICD-10-CM | POA: Insufficient documentation

## 2020-07-30 MED ORDER — DIPHENHYDRAMINE HCL 50 MG/ML IJ SOLN: 50.0000 mg | Freq: Once | INTRAMUSCULAR | Status: DC | PRN

## 2020-07-30 MED ORDER — SODIUM CHLORIDE 0.9 % IV SOLN
100.0000 mg | Freq: Once | INTRAVENOUS | Status: AC
  Administered 2020-07-30: 100 mg via INTRAVENOUS
  Filled 2020-07-30: qty 20

## 2020-07-30 MED ORDER — FAMOTIDINE IN NACL 20-0.9 MG/50ML-% IV SOLN: 20.0000 mg | Freq: Once | INTRAVENOUS | Status: DC | PRN

## 2020-07-30 MED ORDER — EPINEPHRINE 0.3 MG/0.3ML IJ SOAJ: 0.3000 mg | Freq: Once | INTRAMUSCULAR | Status: DC | PRN

## 2020-07-30 MED ORDER — METHYLPREDNISOLONE SODIUM SUCC 125 MG IJ SOLR: 125.0000 mg | Freq: Once | INTRAMUSCULAR | Status: DC | PRN

## 2020-07-30 MED ORDER — ALBUTEROL SULFATE HFA 108 (90 BASE) MCG/ACT IN AERS: 2.0000 | INHALATION_SPRAY | Freq: Once | RESPIRATORY_TRACT | Status: DC | PRN

## 2020-07-30 MED ORDER — SODIUM CHLORIDE 0.9 % IV SOLN: INTRAVENOUS | Status: DC | PRN

## 2020-07-30 NOTE — Discharge Instructions (Signed)
10 Things You Can Do to Manage Your COVID-19 Symptoms at Home If you have possible or confirmed COVID-19: 1. Stay home from work and school. And stay away from other public places. If you must go out, avoid using any kind of public transportation, ridesharing, or taxis. 2. Monitor your symptoms carefully. If your symptoms get worse, call your healthcare provider immediately. 3. Get rest and stay hydrated. 4. If you have a medical appointment, call the healthcare provider ahead of time and tell them that you have or may have COVID-19. 5. For medical emergencies, call 911 and notify the dispatch personnel that you have or may have COVID-19. 6. Cover your cough and sneezes with a tissue or use the inside of your elbow. 7. Wash your hands often with soap and water for at least 20 seconds or clean your hands with an alcohol-based hand sanitizer that contains at least 60% alcohol. 8. As much as possible, stay in a specific room and away from other people in your home. Also, you should use a separate bathroom, if available. If you need to be around other people in or outside of the home, wear a mask. 9. Avoid sharing personal items with other people in your household, like dishes, towels, and bedding. 10. Clean all surfaces that are touched often, like counters, tabletops, and doorknobs. Use household cleaning sprays or wipes according to the label instructions. cdc.gov/coronavirus 05/27/2019 This information is not intended to replace advice given to you by your health care provider. Make sure you discuss any questions you have with your health care provider. Document Revised: 10/29/2019 Document Reviewed: 10/29/2019 Elsevier Patient Education  2020 Elsevier Inc.  

## 2020-07-30 NOTE — Progress Notes (Signed)
  Diagnosis: COVID-19  Physician: Dr. Delford Field  Procedure: [facility] Med: remdesivir infusion - Provided patient with remdesivir fact sheet for patients, parents and caregivers prior to infusion.  Complications: No immediate complications noted.  Discharge: Discharged home   Phylisha Dix Mliss Sax 07/30/2020

## 2020-07-31 ENCOUNTER — Ambulatory Visit (HOSPITAL_COMMUNITY)
Admit: 2020-07-31 | Discharge: 2020-07-31 | Disposition: A | Discharge status: Home / Self Care | Payer: 59 | Attending: Pulmonary Disease | Admitting: Pulmonary Disease

## 2020-07-31 DIAGNOSIS — U071 COVID-19: Secondary | ICD-10-CM | POA: Insufficient documentation

## 2020-07-31 DIAGNOSIS — J1282 Pneumonia due to coronavirus disease 2019: Secondary | ICD-10-CM | POA: Diagnosis not present

## 2020-07-31 MED ORDER — DIPHENHYDRAMINE HCL 50 MG/ML IJ SOLN: 50.0000 mg | Freq: Once | INTRAMUSCULAR | Status: DC | PRN

## 2020-07-31 MED ORDER — SODIUM CHLORIDE 0.9 % IV SOLN: INTRAVENOUS | Status: DC | PRN

## 2020-07-31 MED ORDER — ALBUTEROL SULFATE HFA 108 (90 BASE) MCG/ACT IN AERS: 2.0000 | INHALATION_SPRAY | Freq: Once | RESPIRATORY_TRACT | Status: DC | PRN

## 2020-07-31 MED ORDER — METHYLPREDNISOLONE SODIUM SUCC 125 MG IJ SOLR: 125.0000 mg | Freq: Once | INTRAMUSCULAR | Status: DC | PRN

## 2020-07-31 MED ORDER — EPINEPHRINE 0.3 MG/0.3ML IJ SOAJ: 0.3000 mg | Freq: Once | INTRAMUSCULAR | Status: DC | PRN

## 2020-07-31 MED ORDER — FAMOTIDINE IN NACL 20-0.9 MG/50ML-% IV SOLN: 20.0000 mg | Freq: Once | INTRAVENOUS | Status: DC | PRN

## 2020-07-31 MED ORDER — SODIUM CHLORIDE 0.9 % IV SOLN
100.0000 mg | Freq: Once | INTRAVENOUS | Status: AC
  Administered 2020-07-31: 100 mg via INTRAVENOUS
  Filled 2020-07-31: qty 20

## 2020-07-31 NOTE — Progress Notes (Signed)
  Diagnosis: COVID-19  Physician: Dr. Wright  Procedure: [facility] Med: remdesivir infusion - Provided patient with remdesivir fact sheet for patients, parents and caregivers prior to infusion.  Complications: No immediate complications noted.  Discharge: Discharged home   Colton [name] 07/31/2020  

## 2020-08-01 LAB — CULTURE, BLOOD (ROUTINE X 2)
Culture: NO GROWTH
Culture: NO GROWTH
Special Requests: ADEQUATE
Special Requests: ADEQUATE

## 2021-03-22 IMAGING — DX DG CHEST 1V PORT
1 series · 1 of 1 positions shown · non-contrast
Comparison: 07/21/2020

CLINICAL DATA: Shortness of breath and chest pain.  COVID positive.

EXAM:
PORTABLE CHEST 1 VIEW

[chest ap]
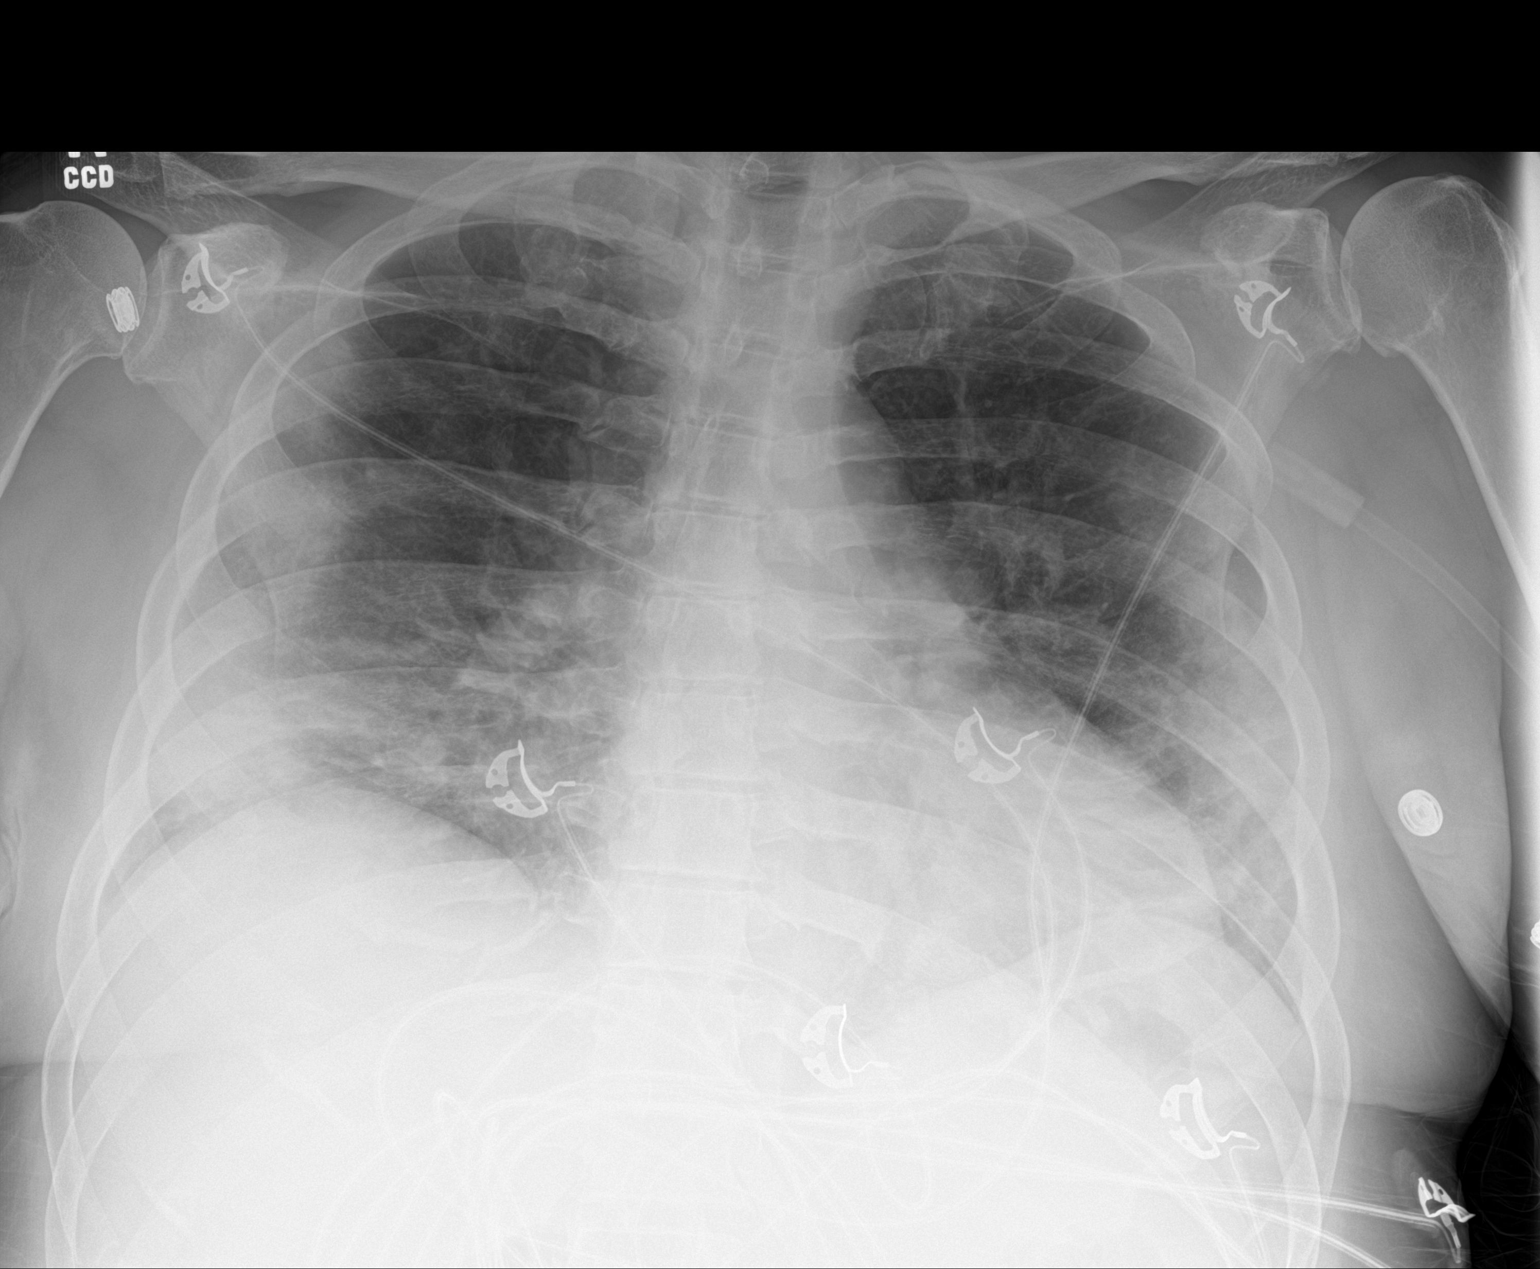

[1 of 1 positions shown; findings below may reference images not displayed]

FINDINGS: 4841 hours. Low lung volumes. Interval progression of the peripheral
patchy ground-glass and consolidative airspace opacity consistent
with multifocal pneumonia. No pleural effusion. Cardiopericardial
silhouette is at upper limits of normal for size. The visualized
bony structures of the thorax show no acute abnormality. Telemetry
leads overlie the chest.
IMPRESSION: Interval progression of peripheral patchy ground-glass and
consolidative airspace disease consistent with multifocal pneumonia.

## 2021-03-22 IMAGING — CT CT ANGIO CHEST
2 of 7 series · 17 of 46 positions shown · IV contrast (APPLIED)
Comparison: None.

CLINICAL DATA: 53-year-old patient who is [id number] positive,
presenting with progressively worsening shortness of breath,
myalgias and malaise. Hypoxemia with oxygen saturation in the 80s.
Elevated D-dimer.

EXAM:
CT ANGIOGRAPHY CHEST WITH CONTRAST
TECHNIQUE: Multidetector CT imaging of the chest was performed using the
standard protocol during bolus administration of intravenous
contrast. Multiplanar CT image reconstructions and MIPs were
obtained to evaluate the vascular anatomy.
CONTRAST:  100mL OMNIPAQUE IOHEXOL 350 MG/ML IV.

[Series 5: thins · axial · 0.77mm/px · z∈[+1334,+1604]mm · 15 of 309 slices shown]
[im 20/309  lung]
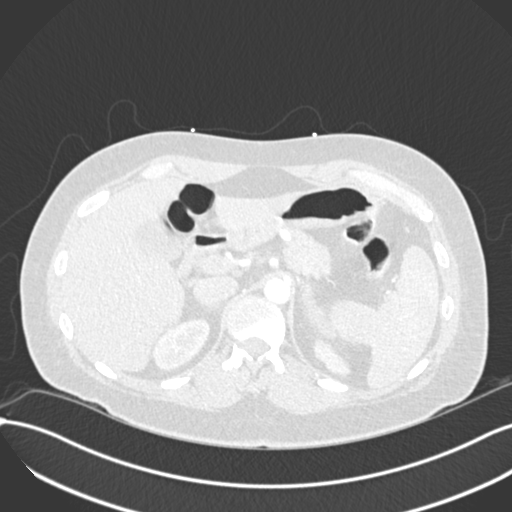
[im 39/309  soft-tissue]
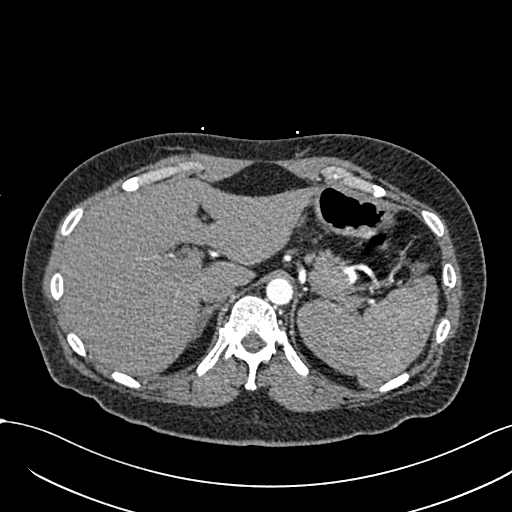
[im 58/309  lung]
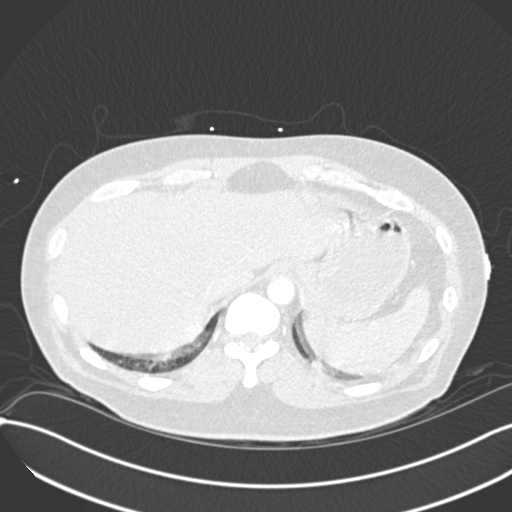
[im 78/309  soft-tissue]
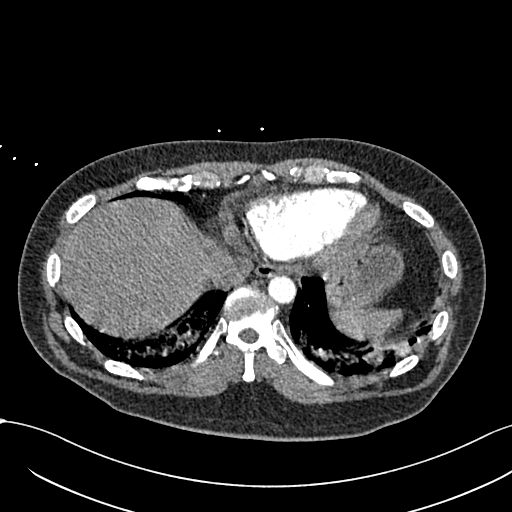
[im 97/309  lung]
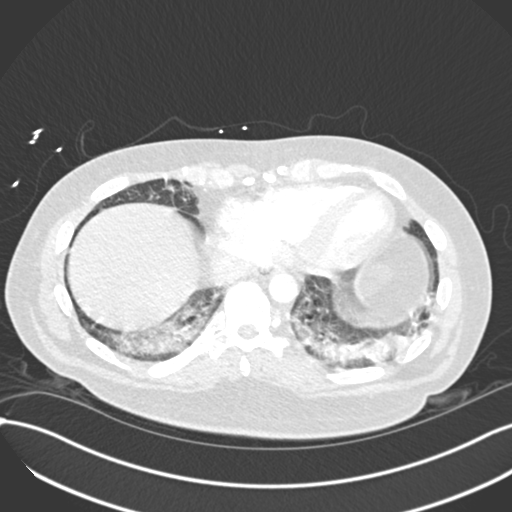
[im 116/309  soft-tissue]
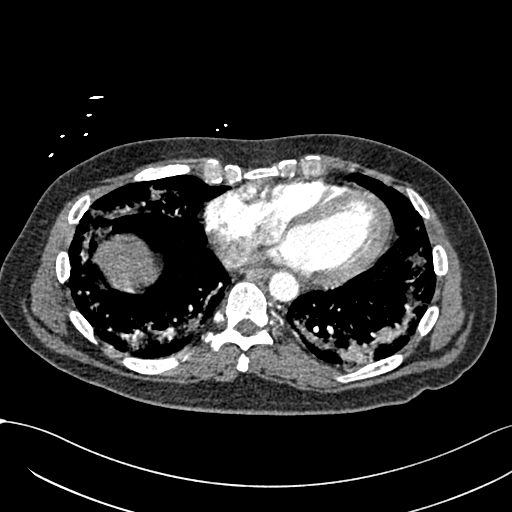
[im 135/309  lung]
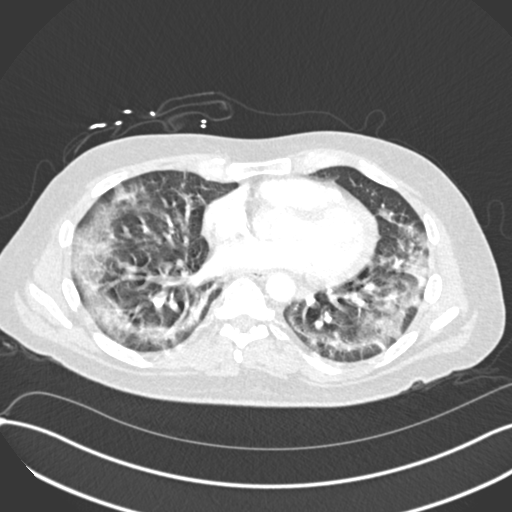
[im 155/309  soft-tissue]
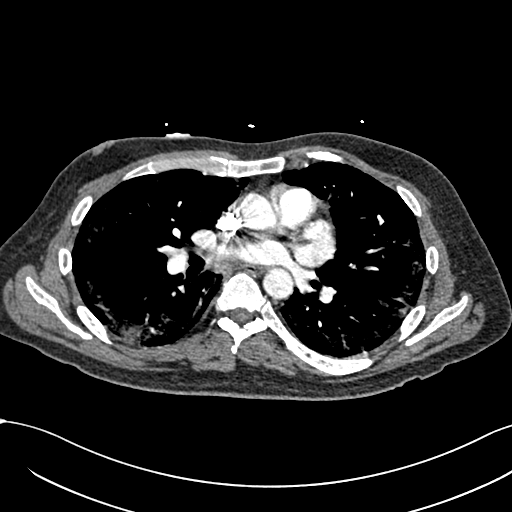
[im 174/309  lung]
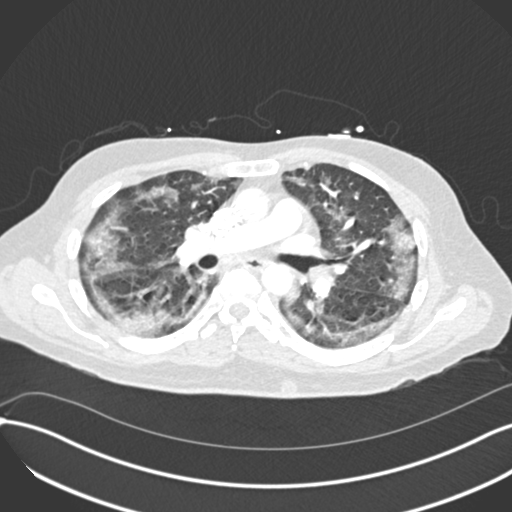
[im 193/309  soft-tissue]
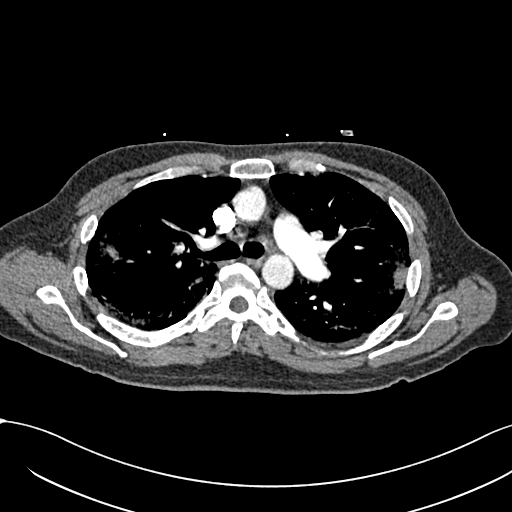
[im 212/309  lung]
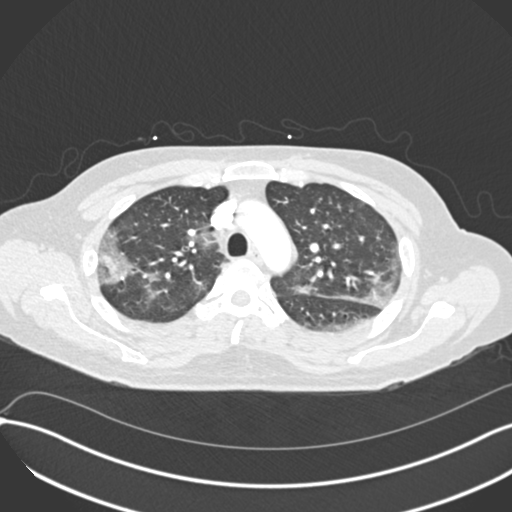
[im 232/309  soft-tissue]
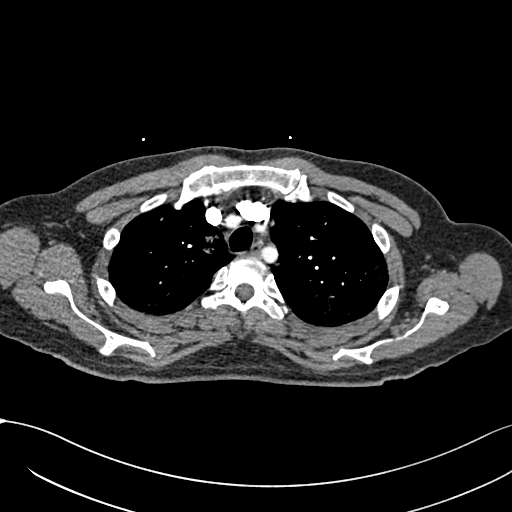
[im 251/309  lung]
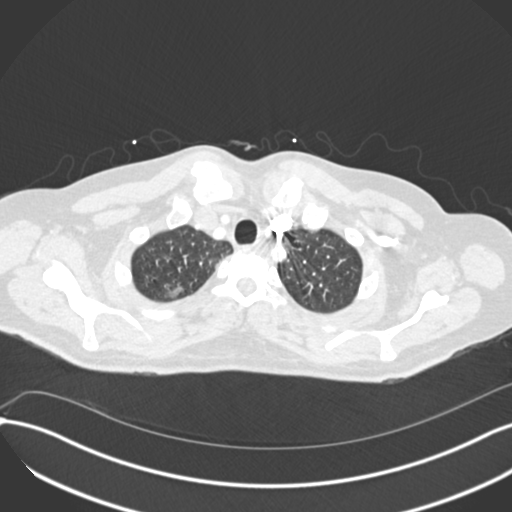
[im 270/309  soft-tissue]
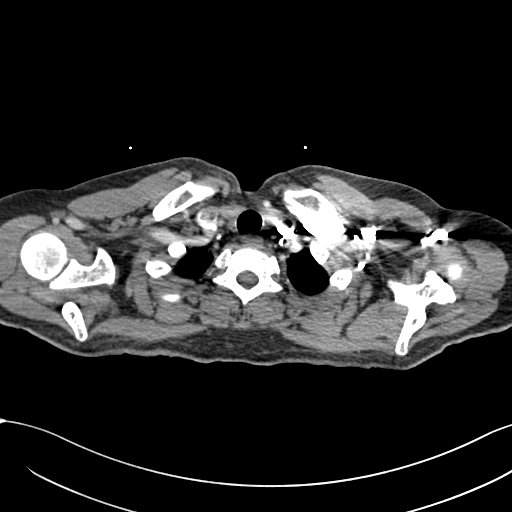
[im 289/309  lung]
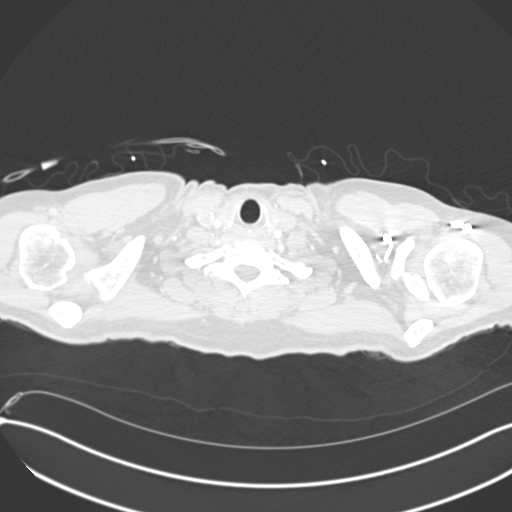

[Series 6: coronal mpr · coronal · 0.62mm/px · 2 of 82 slices shown]
[im 28/82  soft-tissue]
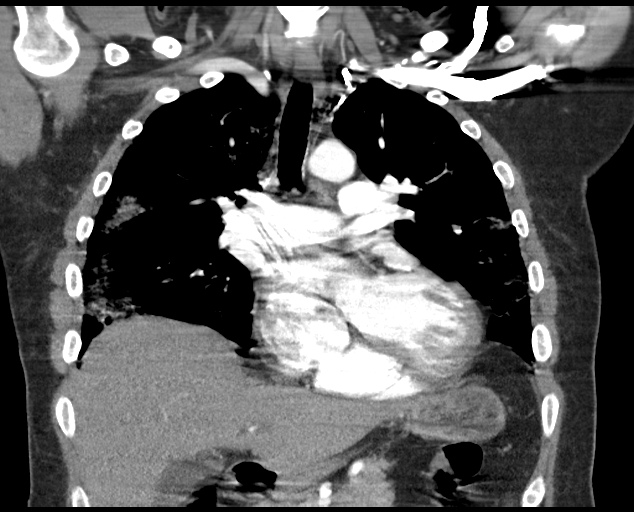
[im 55/82  soft-tissue]
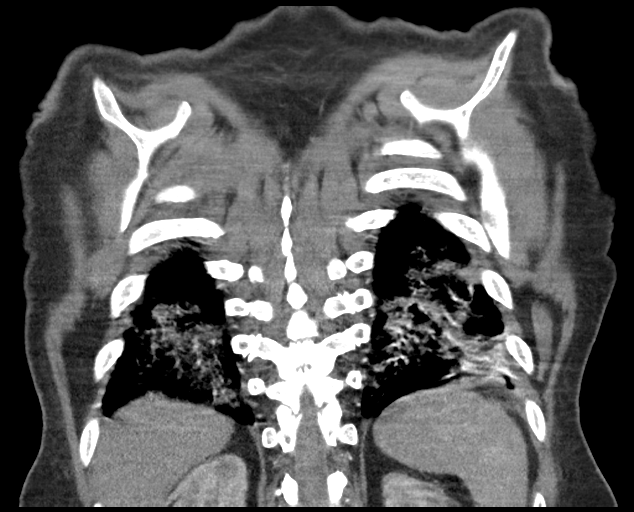

[17 of 46 positions shown; findings below may reference images not displayed]

FINDINGS: Respiratory motion blurred images throughout the examination.

Cardiovascular: Contrast opacification of the pulmonary arteries is
good. Allowing for the respiratory motion, no visible filling
defects within either main pulmonary artery or their segmental
branches in either lung to suggest pulmonary embolism.

Heart size upper normal. No visible coronary atherosclerosis. No
pericardial effusion. No evidence of atherosclerosis involving the
thoracic or upper abdominal aorta or their visualized branches. Note
is made of compression of the proximal celiac trunk by the arcuate
ligament of the diaphragm.

Mediastinum/Nodes: Upper normal sized subcarinal and RIGHT
paraesophageal lymph nodes (stations 6 and 7). No pathologic
lymphadenopathy. Normal appearing esophagus. Normal-appearing
thyroid gland.

Lungs/Pleura: Ground-glass airspace opacities throughout both lungs
in a peripheral distribution, more focally confluent in the LEFT
LOWER LOBE. No evidence of interstitial lung disease. No parenchymal
nodules or masses. No pleural effusions. Central airways patent
without significant bronchial wall thickening.

Upper Abdomen: Unremarkable for the early arterial phase of
enhancement.

Musculoskeletal: Mild degenerative disc disease and spondylosis
involving the visualized lower cervical spine and throughout the
thoracic spine. No acute findings.

Other: Mild BILATERAL gynecomastia, RIGHT greater than LEFT. Benign
sebaceous cyst measuring approximately 1.7 cm involving the skin of
the mid back, just to the LEFT of midline.

Review of the MIP images confirms the above findings.
IMPRESSION: 1. Respiratory motion degraded examination demonstrates no evidence
of central pulmonary embolism.
2. Ground-glass airspace opacities throughout both lungs in a
peripheral distribution, more focally confluent in the LEFT LOWER
LOBE, indicating COVID 19 pneumonia. Other processes such as
influenza pneumonia and organizing pneumonia, as can be seen with
drug toxicity and connective tissue disease, can cause a similar
imaging pattern.
3. Upper normal sized subcarinal and RIGHT paraesophageal lymph
nodes, likely reactive.
4. Mild BILATERAL gynecomastia, RIGHT greater than LEFT.
5. Compression of the proximal celiac trunk by the arcuate ligament
of the diaphragm. Typically this is of no clinical significance.
